# Patient Record
Sex: Male | Born: 1995 | Race: White | Hispanic: No | Marital: Single | Smoking: Never smoker
Health system: Southern US, Community
[De-identification: ages and names within clinical notes are randomized; demographics above are authoritative.]

## PROBLEM LIST (undated history)

## (undated) DIAGNOSIS — F209 Schizophrenia, unspecified: Secondary | ICD-10-CM

## (undated) HISTORY — PX: INNER EAR SURGERY: SHX679

---

## 2019-08-11 ENCOUNTER — Encounter (HOSPITAL_COMMUNITY): Admission: EM | Payer: Self-pay | Attending: Internal Medicine

## 2019-08-11 ENCOUNTER — Emergency Department (HOSPITAL_COMMUNITY)

## 2019-08-11 ENCOUNTER — Other Ambulatory Visit: Payer: Self-pay

## 2019-08-11 ENCOUNTER — Inpatient Hospital Stay (HOSPITAL_COMMUNITY)
Admission: EM | Admit: 2019-08-11 | Discharge: 2019-08-18 | DRG: 327 | Attending: Internal Medicine | Admitting: Internal Medicine

## 2019-08-11 ENCOUNTER — Encounter (HOSPITAL_COMMUNITY): Payer: Self-pay

## 2019-08-11 DIAGNOSIS — T189XXA Foreign body of alimentary tract, part unspecified, initial encounter: Secondary | ICD-10-CM

## 2019-08-11 DIAGNOSIS — Z20822 Contact with and (suspected) exposure to covid-19: Secondary | ICD-10-CM | POA: Diagnosis present

## 2019-08-11 DIAGNOSIS — T182XXA Foreign body in stomach, initial encounter: Secondary | ICD-10-CM | POA: Diagnosis not present

## 2019-08-11 DIAGNOSIS — Z915 Personal history of self-harm: Secondary | ICD-10-CM

## 2019-08-11 DIAGNOSIS — D72829 Elevated white blood cell count, unspecified: Secondary | ICD-10-CM | POA: Diagnosis not present

## 2019-08-11 DIAGNOSIS — F209 Schizophrenia, unspecified: Secondary | ICD-10-CM | POA: Diagnosis present

## 2019-08-11 DIAGNOSIS — Z9889 Other specified postprocedural states: Secondary | ICD-10-CM

## 2019-08-11 DIAGNOSIS — R45851 Suicidal ideations: Secondary | ICD-10-CM

## 2019-08-11 DIAGNOSIS — K259 Gastric ulcer, unspecified as acute or chronic, without hemorrhage or perforation: Secondary | ICD-10-CM | POA: Diagnosis present

## 2019-08-11 DIAGNOSIS — Z539 Procedure and treatment not carried out, unspecified reason: Secondary | ICD-10-CM | POA: Diagnosis not present

## 2019-08-11 HISTORY — DX: Schizophrenia, unspecified: F20.9

## 2019-08-11 LAB — CBC WITH DIFFERENTIAL/PLATELET
Abs Immature Granulocytes: 0.04 10*3/uL (ref 0.00–0.07)
Basophils Absolute: 0.1 10*3/uL (ref 0.0–0.1)
Basophils Relative: 1 %
Eosinophils Absolute: 0.4 10*3/uL (ref 0.0–0.5)
Eosinophils Relative: 3 %
HCT: 45.9 % (ref 39.0–52.0)
Hemoglobin: 14.5 g/dL (ref 13.0–17.0)
Immature Granulocytes: 0 %
Lymphocytes Relative: 22 %
Lymphs Abs: 2.8 10*3/uL (ref 0.7–4.0)
MCH: 29.7 pg (ref 26.0–34.0)
MCHC: 31.6 g/dL (ref 30.0–36.0)
MCV: 93.9 fL (ref 80.0–100.0)
Monocytes Absolute: 0.8 10*3/uL (ref 0.1–1.0)
Monocytes Relative: 6 %
Neutro Abs: 8.5 10*3/uL — ABNORMAL HIGH (ref 1.7–7.7)
Neutrophils Relative %: 68 %
Platelets: 368 10*3/uL (ref 150–400)
RBC: 4.89 MIL/uL (ref 4.22–5.81)
RDW: 13.2 % (ref 11.5–15.5)
WBC: 12.5 10*3/uL — ABNORMAL HIGH (ref 4.0–10.5)
nRBC: 0 % (ref 0.0–0.2)

## 2019-08-11 LAB — BASIC METABOLIC PANEL
Anion gap: 8 (ref 5–15)
BUN: 11 mg/dL (ref 6–20)
CO2: 23 mmol/L (ref 22–32)
Calcium: 8.9 mg/dL (ref 8.9–10.3)
Chloride: 110 mmol/L (ref 98–111)
Creatinine, Ser: 0.73 mg/dL (ref 0.61–1.24)
GFR calc Af Amer: >60 mL/min (ref >60)
GFR calc non Af Amer: >60 mL/min (ref >60)
Glucose, Bld: 89 mg/dL (ref 70–99)
Potassium: 3.8 mmol/L (ref 3.5–5.1)
Sodium: 141 mmol/L (ref 135–145)

## 2019-08-11 LAB — RESPIRATORY PANEL BY RT PCR (FLU A&B, COVID)
Influenza A by PCR: NEGATIVE
Influenza B by PCR: NEGATIVE
SARS Coronavirus 2 by RT PCR: NEGATIVE

## 2019-08-11 SURGERY — ESOPHAGOGASTRODUODENOSCOPY (EGD) WITH PROPOFOL
Anesthesia: Monitor Anesthesia Care

## 2019-08-11 MED ORDER — FENTANYL CITRATE (PF) 100 MCG/2ML IJ SOLN
INTRAMUSCULAR | Status: AC
  Filled 2019-08-11: qty 2

## 2019-08-11 MED ORDER — PROPOFOL 10 MG/ML IV BOLUS
INTRAVENOUS | Status: AC
  Filled 2019-08-11: qty 20

## 2019-08-11 MED ORDER — MIDAZOLAM HCL 2 MG/2ML IJ SOLN
INTRAMUSCULAR | Status: AC
  Filled 2019-08-11: qty 2

## 2019-08-11 MED ORDER — SODIUM CHLORIDE 0.9 % IV SOLN: INTRAVENOUS | Status: DC

## 2019-08-11 NOTE — Consult Note (Signed)
[facility]  Referring Provider: ER, Dr. Madilyn Hook Primary Care Physician:  None Primary Gastroenterologist:  Gentry Fitz  Reason for Consultation: Foreign body in UGI tract   HPI:  Norman Vasquez is a 24 y.o. male prisoner who swallowed a plastic spoon around 5pm tonight.  He says he swallowed it whole.  It was about 8-10cm long, hurt his throat going down. He has pains in his epigatrium now. No dysphagia. He is handling his secretions well.  No fevers, chills, no n/v   History reviewed. No pertinent past medical history.  History reviewed. No pertinent surgical history.  Prior to Admission medications   Not on File    Current Facility-Administered Medications  Medication Dose Route Frequency Provider Last Rate Last Admin  . [START ON 08/12/2019] 0.9 %  sodium chloride infusion   Intravenous Continuous Rachael Fee, MD       No current outpatient medications on file.    Allergies as of 08/11/2019 - Review Complete 08/11/2019  Allergen Reaction Noted  . Amoxicillin  08/11/2019  . Penicillins  08/11/2019  . Tylenol [acetaminophen]  08/11/2019    History reviewed. No pertinent family history.  Social History   Socioeconomic History  . Marital status: Single    Spouse name: Not on file  . Number of children: Not on file  . Years of education: Not on file  . Highest education level: Not on file  Occupational History  . Not on file  Tobacco Use  . Smoking status: Never Smoker  . Smokeless tobacco: Never Used  Substance and Sexual Activity  . Alcohol use: Not on file  . Drug use: Not on file  . Sexual activity: Not on file  Other Topics Concern  . Not on file  Social History Narrative  . Not on file   Social Determinants of Health   Financial Resource Strain:   . Difficulty of Paying Living Expenses:   Food Insecurity:   . Worried About Programme researcher, broadcasting/film/video in the Last Year:   . Barista in the Last Year:   Transportation Needs:   .  Freight forwarder (Medical):   Marland Kitchen Lack of Transportation (Non-Medical):   Physical Activity:   . Days of Exercise per Week:   . Minutes of Exercise per Session:   Stress:   . Feeling of Stress :   Social Connections:   . Frequency of Communication with Friends and Family:   . Frequency of Social Gatherings with Friends and Family:   . Attends Religious Services:   . Active Member of Clubs or Organizations:   . Attends Banker Meetings:   Marland Kitchen Marital Status:   Intimate Partner Violence:   . Fear of Current or Ex-Partner:   . Emotionally Abused:   Marland Kitchen Physically Abused:   . Sexually Abused:     Review of Systems: Pertinent positive and negative review of systems were noted in the above HPI section. Complete review of systems was performed and was otherwise normal.  Physical Exam: Vital signs in last 24 hours: Temp:  [98.8 F (37.1 C)] 98.8 F (37.1 C) (03/14 1828) Pulse Rate:  [86-105] 95 (03/14 2215) Resp:  [17] 17 (03/14 1828) BP: (130-141)/(91-98) 130/92 (03/14 2215) SpO2:  [86 %-100 %] 86 % (03/14 2215)   Constitutional: generally well-appearing Psychiatric: alert and oriented x3 Eyes: extraocular movements intact Mouth: oral pharynx moist, no lesions Neck: supple no lymphadenopathy Cardiovascular: heart regular rate and rhythm Lungs: clear to auscultation bilaterally  Abdomen: soft, nontender, nondistended, no obvious ascites, no peritoneal signs, normal bowel sounds Extremities: no lower extremity edema bilaterally Skin: no lesions on visible extremities  Lab Results: Recent Labs    08/11/19 2125  WBC 12.5*  HGB 14.5  HCT 45.9  PLT 368  MCV 93.9   BMET Recent Labs    08/11/19 2125  NA 141  K 3.8  CL 110  CO2 23  GLUCOSE 89  BUN 11  CREATININE 0.73  CALCIUM 8.9    Imaging/Other Results: DG Neck Soft Tissue  Result Date: 08/11/2019 CLINICAL DATA:  Rule out foreign body. EXAM: NECK SOFT TISSUES - 1+ VIEW COMPARISON:  None. FINDINGS:  There is no evidence of retropharyngeal soft tissue swelling or epiglottic enlargement. The cervical airway is unremarkable and no radio-opaque foreign body identified. IMPRESSION: Negative. Electronically Signed   By: Virgina Norfolk M.D.   On: 08/11/2019 19:28   DG Chest Portable 1 View  Result Date: 08/11/2019 CLINICAL DATA:  Rule out foreign body. EXAM: PORTABLE CHEST 1 VIEW COMPARISON:  Oct 16, 2018 FINDINGS: The heart size and mediastinal contours are within normal limits. Both lungs are clear. The visualized skeletal structures are unremarkable. A 6 mm radiopaque foreign body is seen overlying the right upper quadrant. This represents a new finding when compared to the prior study. IMPRESSION: 6 mm radiopaque foreign body overlying the right upper quadrant. This represents a new finding when compared to the prior study and may be consistent with interval cholecystectomy. Correlation with the patient's surgical history is recommended. Electronically Signed   By: Virgina Norfolk M.D.   On: 08/11/2019 19:27      Impression/Plan: 24 y.o. male who swallowed a long blunt object about 6 hours ago (plastic spoon).  He understands that he is at risk for perforation, bleeding, obstruction if the spoon is left inside. These could require surgery if they occur. I recommended EGD which he understands carries risk as well (perforation, bleeding, etc) and he agreed to proceed.  Milus Banister, MD  08/11/2019, 11:52 PM [facility] Pager [phone]

## 2019-08-11 NOTE — Progress Notes (Signed)
I spoke with PA Lillia Abed, he swallowed a plastic spoon whole to hurt himself. IT is unlikely that this will pass out of his stomach and so he needs endoscopy for retrieval.  Will need to be with anesthesia support, GA.  HE is getting a covid test and the results to be called to endo RN on call.  She is alerting anesthesia now as well.

## 2019-08-11 NOTE — H&P (View-Only) (Signed)
[facility]  Referring Provider: ER, Dr. Madilyn Hook Primary Care Physician:  None Primary Gastroenterologist:  Gentry Fitz  Reason for Consultation: Foreign body in UGI tract   HPI:  Norman Vasquez is a 24 y.o. male prisoner who swallowed a plastic spoon around 5pm tonight.  He says he swallowed it whole.  It was about 8-10cm long, hurt his throat going down. He has pains in his epigatrium now. No dysphagia. He is handling his secretions well.  No fevers, chills, no n/v   History reviewed. No pertinent past medical history.  History reviewed. No pertinent surgical history.  Prior to Admission medications   Not on File    Current Facility-Administered Medications  Medication Dose Route Frequency Provider Last Rate Last Admin  . [START ON 08/12/2019] 0.9 %  sodium chloride infusion   Intravenous Continuous Rachael Fee, MD       No current outpatient medications on file.    Allergies as of 08/11/2019 - Review Complete 08/11/2019  Allergen Reaction Noted  . Amoxicillin  08/11/2019  . Penicillins  08/11/2019  . Tylenol [acetaminophen]  08/11/2019    History reviewed. No pertinent family history.  Social History   Socioeconomic History  . Marital status: Single    Spouse name: Not on file  . Number of children: Not on file  . Years of education: Not on file  . Highest education level: Not on file  Occupational History  . Not on file  Tobacco Use  . Smoking status: Never Smoker  . Smokeless tobacco: Never Used  Substance and Sexual Activity  . Alcohol use: Not on file  . Drug use: Not on file  . Sexual activity: Not on file  Other Topics Concern  . Not on file  Social History Narrative  . Not on file   Social Determinants of Health   Financial Resource Strain:   . Difficulty of Paying Living Expenses:   Food Insecurity:   . Worried About Programme researcher, broadcasting/film/video in the Last Year:   . Barista in the Last Year:   Transportation Needs:   .  Freight forwarder (Medical):   Marland Kitchen Lack of Transportation (Non-Medical):   Physical Activity:   . Days of Exercise per Week:   . Minutes of Exercise per Session:   Stress:   . Feeling of Stress :   Social Connections:   . Frequency of Communication with Friends and Family:   . Frequency of Social Gatherings with Friends and Family:   . Attends Religious Services:   . Active Member of Clubs or Organizations:   . Attends Banker Meetings:   Marland Kitchen Marital Status:   Intimate Partner Violence:   . Fear of Current or Ex-Partner:   . Emotionally Abused:   Marland Kitchen Physically Abused:   . Sexually Abused:     Review of Systems: Pertinent positive and negative review of systems were noted in the above HPI section. Complete review of systems was performed and was otherwise normal.  Physical Exam: Vital signs in last 24 hours: Temp:  [98.8 F (37.1 C)] 98.8 F (37.1 C) (03/14 1828) Pulse Rate:  [86-105] 95 (03/14 2215) Resp:  [17] 17 (03/14 1828) BP: (130-141)/(91-98) 130/92 (03/14 2215) SpO2:  [86 %-100 %] 86 % (03/14 2215)   Constitutional: generally well-appearing Psychiatric: alert and oriented x3 Eyes: extraocular movements intact Mouth: oral pharynx moist, no lesions Neck: supple no lymphadenopathy Cardiovascular: heart regular rate and rhythm Lungs: clear to auscultation bilaterally  Abdomen: soft, nontender, nondistended, no obvious ascites, no peritoneal signs, normal bowel sounds Extremities: no lower extremity edema bilaterally Skin: no lesions on visible extremities  Lab Results: Recent Labs    08/11/19 2125  WBC 12.5*  HGB 14.5  HCT 45.9  PLT 368  MCV 93.9   BMET Recent Labs    08/11/19 2125  NA 141  K 3.8  CL 110  CO2 23  GLUCOSE 89  BUN 11  CREATININE 0.73  CALCIUM 8.9    Imaging/Other Results: DG Neck Soft Tissue  Result Date: 08/11/2019 CLINICAL DATA:  Rule out foreign body. EXAM: NECK SOFT TISSUES - 1+ VIEW COMPARISON:  None. FINDINGS:  There is no evidence of retropharyngeal soft tissue swelling or epiglottic enlargement. The cervical airway is unremarkable and no radio-opaque foreign body identified. IMPRESSION: Negative. Electronically Signed   By: Thaddeus  Houston M.D.   On: 08/11/2019 19:28   DG Chest Portable 1 View  Result Date: 08/11/2019 CLINICAL DATA:  Rule out foreign body. EXAM: PORTABLE CHEST 1 VIEW COMPARISON:  Oct 16, 2018 FINDINGS: The heart size and mediastinal contours are within normal limits. Both lungs are clear. The visualized skeletal structures are unremarkable. A 6 mm radiopaque foreign body is seen overlying the right upper quadrant. This represents a new finding when compared to the prior study. IMPRESSION: 6 mm radiopaque foreign body overlying the right upper quadrant. This represents a new finding when compared to the prior study and may be consistent with interval cholecystectomy. Correlation with the patient's surgical history is recommended. Electronically Signed   By: Thaddeus  Houston M.D.   On: 08/11/2019 19:27      Impression/Plan: 24 y.o. male who swallowed a long blunt object about 6 hours ago (plastic spoon).  He understands that he is at risk for perforation, bleeding, obstruction if the spoon is left inside. These could require surgery if they occur. I recommended EGD which he understands carries risk as well (perforation, bleeding, etc) and he agreed to proceed.  Shiela Bruns P Lavayah Vita, MD  08/11/2019, 11:52 PM [facility] Pager [phone]   

## 2019-08-11 NOTE — Anesthesia Preprocedure Evaluation (Addendum)
Anesthesia Evaluation  Patient identified by MRN, date of birth, ID band Patient awake    Reviewed: Allergy & Precautions, NPO status , Patient's Chart, lab work & pertinent test results  History of Anesthesia Complications (+) PONV  Airway Mallampati: II  TM Distance: >3 FB Neck ROM: Full    Dental no notable dental hx. (+) Teeth Intact, Dental Advisory Given   Pulmonary neg pulmonary ROS,    Pulmonary exam normal breath sounds clear to auscultation       Cardiovascular negative cardio ROS Normal cardiovascular exam Rhythm:Regular Rate:Normal     Neuro/Psych negative neurological ROS  negative psych ROS   GI/Hepatic negative GI ROS, Neg liver ROS,   Endo/Other  negative endocrine ROS  Renal/GU      Musculoskeletal negative musculoskeletal ROS (+)   Abdominal   Peds  Hematology negative hematology ROS (+)   Anesthesia Other Findings   Reproductive/Obstetrics                           Anesthesia Physical Anesthesia Plan  ASA: I and emergent  Anesthesia Plan: General   Post-op Pain Management:    Induction: Intravenous, Cricoid pressure planned and Rapid sequence  PONV Risk Score and Plan: 3 and Treatment may vary due to age or medical condition, Ondansetron and Dexamethasone  Airway Management Planned: Oral ETT  Additional Equipment: None  Intra-op Plan:   Post-operative Plan: Extubation in OR  Informed Consent: I have reviewed the patients History and Physical, chart, labs and discussed the procedure including the risks, benefits and alternatives for the proposed anesthesia with the patient or authorized representative who has indicated his/her understanding and acceptance.     Dental advisory given  Plan Discussed with: CRNA  Anesthesia Plan Comments:       Anesthesia Quick Evaluation

## 2019-08-11 NOTE — ED Provider Notes (Signed)
[facility]-EMERGENCY DEPT Provider Note   CSN: [id number] Arrival date & time: 08/11/19  1807     History Chief Complaint  Patient presents with  . Ingestion of Foreign body    Norman Vasquez is a 24 y.o. male brought in from [facility] for evaluation of swallowed foreign body.  He reports that about 5 PM, he swallowed a plastic spoon.  He reports some discomfort to his throat and upper chest area.  He has been able to tolerate his secretions and has had no difficulty swallowing.  Denies any pain with swelling.  Denies any difficulty breathing, abdominal pain.  He states that he was trying to do this to hurt himself but not kill himself.  He states he has a history of trying to hurt himself.  He is currently on [facility] watch [facility] jail.  The officers who are here at bedside did not actually witness the incident.  They state that other officers saw it but cannot confirm whether or not he swallowed it.  Patient states he swallowed the entire spoon.  The history is provided by the patient.       History reviewed. No pertinent past medical history.  There are no problems to display for this patient.   History reviewed. No pertinent surgical history.     History reviewed. No pertinent family history.  Social History   Tobacco Use  . Smoking status: Never Smoker  . Smokeless tobacco: Never Used  Substance Use Topics  . Alcohol use: Not on file  . Drug use: Not on file    Home Medications Prior to Admission medications   Not on File    Allergies    Amoxicillin, Penicillins, and Tylenol [acetaminophen]  Review of Systems   Review of Systems  HENT: Positive for sore throat. Negative for trouble swallowing.   Respiratory: Negative for shortness of breath.   Gastrointestinal: Negative for vomiting.  All other systems reviewed and are negative.   Physical Exam Updated Vital Signs BP (!) 126/93   Pulse 100   Temp 98.8 F (37.1 C)  (Oral)   Resp 17   SpO2 98%   Physical Exam Vitals and nursing note reviewed.  Constitutional:      Appearance: He is well-developed.  HENT:     Head: Normocephalic and atraumatic.     Mouth/Throat:     Comments: Posterior oropharynx is clear without any signs of erythema, edema. Eyes:     General: No scleral icterus.       Right eye: No discharge.        Left eye: No discharge.     Conjunctiva/sclera: Conjunctivae normal.  Neck:     Comments: Neck is without edema, crepitus.  No evidence of swelling.  He is able to swallow with any difficulty. Pulmonary:     Effort: Pulmonary effort is normal.     Breath sounds: Normal breath sounds.     Comments: Lungs clear to auscultation bilaterally.  Symmetric chest rise.  No wheezing, rales, rhonchi. Abdominal:     Comments: Abdomen is soft, non-distended, non-tender. No rigidity, No guarding. No peritoneal signs.  Skin:    General: Skin is warm and dry.  Neurological:     Mental Status: He is alert.  Psychiatric:        Speech: Speech normal.        Behavior: Behavior normal.     ED Results / Procedures / Treatments   Labs (all labs ordered are  listed, but only abnormal results are displayed) Labs Reviewed  CBC WITH DIFFERENTIAL/PLATELET - Abnormal; Notable for the following components:      Result Value   WBC 12.5 (*)    Neutro Abs 8.5 (*)    All other components within normal limits  RESPIRATORY PANEL BY RT PCR (FLU A&B, COVID)  BASIC METABOLIC PANEL    EKG None  Radiology DG Neck Soft Tissue  Result Date: 08/11/2019 CLINICAL DATA:  Rule out foreign body. EXAM: NECK SOFT TISSUES - 1+ VIEW COMPARISON:  None. FINDINGS: There is no evidence of retropharyngeal soft tissue swelling or epiglottic enlargement. The cervical airway is unremarkable and no radio-opaque foreign body identified. IMPRESSION: Negative. Electronically Signed   By: Aram Candela M.D.   On: 08/11/2019 19:28   DG Chest Portable 1 View  Result  Date: 08/11/2019 CLINICAL DATA:  Rule out foreign body. EXAM: PORTABLE CHEST 1 VIEW COMPARISON:  Oct 16, 2018 FINDINGS: The heart size and mediastinal contours are within normal limits. Both lungs are clear. The visualized skeletal structures are unremarkable. A 6 mm radiopaque foreign body is seen overlying the right upper quadrant. This represents a new finding when compared to the prior study. IMPRESSION: 6 mm radiopaque foreign body overlying the right upper quadrant. This represents a new finding when compared to the prior study and may be consistent with interval cholecystectomy. Correlation with the patient's surgical history is recommended. Electronically Signed   By: Aram Candela M.D.   On: 08/11/2019 19:27    Procedures Procedures (including critical care time)  Medications Ordered in ED Medications  0.9 %  sodium chloride infusion (has no administration in time range)    ED Course  I have reviewed the triage vital signs and the nursing notes.  Pertinent labs & imaging results that were available during my care of the patient were reviewed by me and considered in my medical decision making (see chart for details).    MDM Rules/Calculators/A&P                      24 year old male who presents for evaluation of swallowed foreign body.  He is currently at jail and reports swallowing a plastic spoon.  Reports discomfort in his throat and upper chest.  No difficulty breathing, difficulty swallowing, abdominal pain, vomiting. Patient is afebrile, non-toxic appearing, sitting comfortably on examination table. Vital signs reviewed and stable.  On exam, posterior oropharynx is clear.  No signs of respiratory distress.  No crepitus noted to neck or chest.  We will plan for x-rays of chest and soft tissue neck.  Discussed with police officers.  They do not require psychiatric clearance from our department as patient has already been listed on psych watch at prison.   Chest x-ray shows a  6 mm radiopaque foreign body overlying the right upper quadrant.  This represents a new finding when compared to prior study.  X-ray of soft tissue neck shows no acute abnormalities.  Reevaluation.  Patient with no abdominal pain.  He has no abdominal incision scar.  He denies any history of abdominal surgery.  Discussed patient with Dr. Christella Hartigan (GI).  Given the unclear location based on x-ray, he cannot exclude if it is in the stomach or not.  He recommends obtaining a CT abdomen pelvis to see if there is any residual pieces in the stomach.  If there are, he might need to do an endoscopy to remove it but if it is past the stomach,  there is no other acute intervention.  Discussed with Dr. Ardis Hughs.  Given that patient is reporting that he swallowed the entire spoon, it would not pass without any intervention.  He states that it may not and certainly show up on x-ray either.  He will plan to take patient to the endoscopy suite once Covid test is back.  Covid test is negative.  BMP is unremarkable.  CBC shows leukocytosis of 12.5.  I updated the endoscopy nurse.  I discussed with Dr. Ardis Hughs also.  He is aware. Patient will go to endoscopy suite for removal.   Norman Vasquez was evaluated in Emergency Department on 08/11/2019 for the symptoms described in the history of present illness. He was evaluated in the context of the global COVID-19 pandemic, which necessitated consideration that the patient might be at risk for infection with the SARS-CoV-2 virus that causes COVID-19. Institutional protocols and algorithms that pertain to the evaluation of patients at risk for COVID-19 are in a state of rapid change based on information released by regulatory bodies including the CDC and federal and state organizations. These policies and algorithms were followed during the patient's care in the ED.  Portions of this note were generated with [employer]. Dictation errors may occur despite best  attempts at proofreading.   Final Clinical Impression(s) / ED Diagnoses Final diagnoses:  Swallowed foreign body, initial encounter    Rx / DC Orders ED Discharge Orders    None       Desma Mcgregor 08/11/19 2355    Quintella Reichert, MD 08/14/19 [phone]

## 2019-08-11 NOTE — ED Triage Notes (Signed)
Pt arrives in custody from [facility], states he swallowed a plastic spoon at approx 1730 and believes it is still stuck in his throat, c/o throat pain.

## 2019-08-12 ENCOUNTER — Emergency Department (HOSPITAL_COMMUNITY): Admitting: Anesthesiology

## 2019-08-12 ENCOUNTER — Encounter (HOSPITAL_COMMUNITY): Admission: EM | Payer: Self-pay | Attending: Internal Medicine

## 2019-08-12 ENCOUNTER — Encounter (HOSPITAL_COMMUNITY): Payer: Self-pay

## 2019-08-12 ENCOUNTER — Inpatient Hospital Stay (HOSPITAL_COMMUNITY): Admitting: Certified Registered"

## 2019-08-12 ENCOUNTER — Inpatient Hospital Stay (HOSPITAL_COMMUNITY)

## 2019-08-12 DIAGNOSIS — Z20822 Contact with and (suspected) exposure to covid-19: Secondary | ICD-10-CM | POA: Diagnosis present

## 2019-08-12 DIAGNOSIS — T189XXD Foreign body of alimentary tract, part unspecified, subsequent encounter: Secondary | ICD-10-CM

## 2019-08-12 DIAGNOSIS — Z915 Personal history of self-harm: Secondary | ICD-10-CM | POA: Diagnosis not present

## 2019-08-12 DIAGNOSIS — Z539 Procedure and treatment not carried out, unspecified reason: Secondary | ICD-10-CM | POA: Diagnosis not present

## 2019-08-12 DIAGNOSIS — T189XXA Foreign body of alimentary tract, part unspecified, initial encounter: Secondary | ICD-10-CM

## 2019-08-12 DIAGNOSIS — R45851 Suicidal ideations: Secondary | ICD-10-CM

## 2019-08-12 DIAGNOSIS — K259 Gastric ulcer, unspecified as acute or chronic, without hemorrhage or perforation: Secondary | ICD-10-CM | POA: Diagnosis present

## 2019-08-12 DIAGNOSIS — T182XXD Foreign body in stomach, subsequent encounter: Secondary | ICD-10-CM

## 2019-08-12 DIAGNOSIS — F209 Schizophrenia, unspecified: Secondary | ICD-10-CM | POA: Diagnosis present

## 2019-08-12 DIAGNOSIS — T182XXA Foreign body in stomach, initial encounter: Secondary | ICD-10-CM | POA: Diagnosis present

## 2019-08-12 DIAGNOSIS — D72829 Elevated white blood cell count, unspecified: Secondary | ICD-10-CM | POA: Diagnosis not present

## 2019-08-12 HISTORY — PX: ESOPHAGOGASTRODUODENOSCOPY: SHX5428

## 2019-08-12 HISTORY — PX: LAPAROTOMY: SHX154

## 2019-08-12 HISTORY — PX: FOREIGN BODY REMOVAL: SHX962

## 2019-08-12 LAB — CBC WITH DIFFERENTIAL/PLATELET
Abs Immature Granulocytes: 0.04 10*3/uL (ref 0.00–0.07)
Basophils Absolute: 0 10*3/uL (ref 0.0–0.1)
Basophils Relative: 0 %
Eosinophils Absolute: 0 10*3/uL (ref 0.0–0.5)
Eosinophils Relative: 0 %
HCT: 44.3 % (ref 39.0–52.0)
Hemoglobin: 14.1 g/dL (ref 13.0–17.0)
Immature Granulocytes: 0 %
Lymphocytes Relative: 11 %
Lymphs Abs: 1.2 10*3/uL (ref 0.7–4.0)
MCH: 29.1 pg (ref 26.0–34.0)
MCHC: 31.8 g/dL (ref 30.0–36.0)
MCV: 91.5 fL (ref 80.0–100.0)
Monocytes Absolute: 0.1 10*3/uL (ref 0.1–1.0)
Monocytes Relative: 1 %
Neutro Abs: 9.6 10*3/uL — ABNORMAL HIGH (ref 1.7–7.7)
Neutrophils Relative %: 88 %
Platelets: 333 10*3/uL (ref 150–400)
RBC: 4.84 MIL/uL (ref 4.22–5.81)
RDW: 13.1 % (ref 11.5–15.5)
WBC: 11 10*3/uL — ABNORMAL HIGH (ref 4.0–10.5)
nRBC: 0 % (ref 0.0–0.2)

## 2019-08-12 LAB — COMPREHENSIVE METABOLIC PANEL
ALT: 80 U/L — ABNORMAL HIGH (ref 0–44)
AST: 41 U/L (ref 15–41)
Albumin: 3.6 g/dL (ref 3.5–5.0)
Alkaline Phosphatase: 63 U/L (ref 38–126)
Anion gap: 8 (ref 5–15)
BUN: 13 mg/dL (ref 6–20)
CO2: 25 mmol/L (ref 22–32)
Calcium: 9.2 mg/dL (ref 8.9–10.3)
Chloride: 106 mmol/L (ref 98–111)
Creatinine, Ser: 0.9 mg/dL (ref 0.61–1.24)
GFR calc Af Amer: >60 mL/min (ref >60)
GFR calc non Af Amer: >60 mL/min (ref >60)
Glucose, Bld: 121 mg/dL — ABNORMAL HIGH (ref 70–99)
Potassium: 4.4 mmol/L (ref 3.5–5.1)
Sodium: 139 mmol/L (ref 135–145)
Total Bilirubin: 0.7 mg/dL (ref 0.3–1.2)
Total Protein: 8 g/dL (ref 6.5–8.1)

## 2019-08-12 LAB — ABO/RH: ABO/RH(D): A POS

## 2019-08-12 LAB — TYPE AND SCREEN
ABO/RH(D): A POS
Antibody Screen: NEGATIVE

## 2019-08-12 LAB — ACETAMINOPHEN LEVEL: Acetaminophen (Tylenol), Serum: <10 ug/mL — ABNORMAL LOW (ref 10–30)

## 2019-08-12 LAB — HIV ANTIBODY (ROUTINE TESTING W REFLEX): HIV Screen 4th Generation wRfx: NONREACTIVE

## 2019-08-12 LAB — PROTIME-INR
INR: 1.2 (ref 0.8–1.2)
Prothrombin Time: 14.6 seconds (ref 11.4–15.2)

## 2019-08-12 LAB — SALICYLATE LEVEL: Salicylate Lvl: <7 mg/dL — ABNORMAL LOW (ref 7.0–30.0)

## 2019-08-12 SURGERY — EGD (ESOPHAGOGASTRODUODENOSCOPY)
Anesthesia: General

## 2019-08-12 SURGERY — LAPAROTOMY, EXPLORATORY
Anesthesia: General | Site: Abdomen

## 2019-08-12 MED ORDER — DEXTROSE-NACL 5-0.9 % IV SOLN: INTRAVENOUS | Status: AC

## 2019-08-12 MED ORDER — FENTANYL CITRATE (PF) 100 MCG/2ML IJ SOLN
INTRAMUSCULAR | Status: AC
  Administered 2019-08-12: 50 ug via INTRAVENOUS
  Filled 2019-08-12: qty 2

## 2019-08-12 MED ORDER — SUGAMMADEX SODIUM 200 MG/2ML IV SOLN
INTRAVENOUS | Status: DC | PRN
  Administered 2019-08-12: 100 mg via INTRAVENOUS
  Administered 2019-08-12: 400 mg via INTRAVENOUS

## 2019-08-12 MED ORDER — ONDANSETRON HCL 4 MG/2ML IJ SOLN
INTRAMUSCULAR | Status: AC
  Filled 2019-08-12: qty 2

## 2019-08-12 MED ORDER — HYDROMORPHONE 1 MG/ML IV SOLN
INTRAVENOUS | Status: DC
  Administered 2019-08-12: 3 mg via INTRAVENOUS
  Administered 2019-08-12: 30 mg via INTRAVENOUS
  Administered 2019-08-12 – 2019-08-13 (×2): 1.8 mg via INTRAVENOUS
  Administered 2019-08-13: 1.5 mg via INTRAVENOUS
  Administered 2019-08-13: 1.8 mg via INTRAVENOUS
  Administered 2019-08-13: 2.4 mg via INTRAVENOUS
  Administered 2019-08-13: 0.9 mg via INTRAVENOUS
  Administered 2019-08-13: 2.4 mg via INTRAVENOUS
  Administered 2019-08-13: 1.2 mg via INTRAVENOUS
  Administered 2019-08-14: 3.6 mg via INTRAVENOUS
  Administered 2019-08-14: 30 mg via INTRAVENOUS
  Administered 2019-08-14: 2.4 mg via INTRAVENOUS
  Administered 2019-08-14: 3.6 mg via INTRAVENOUS
  Administered 2019-08-14: 3 mg via INTRAVENOUS
  Administered 2019-08-14: 1.5 mg via INTRAVENOUS
  Administered 2019-08-15: 2.1 mg via INTRAVENOUS
  Filled 2019-08-12 (×2): qty 30

## 2019-08-12 MED ORDER — DEXTROSE-NACL 5-0.9 % IV SOLN: INTRAVENOUS | Status: DC

## 2019-08-12 MED ORDER — LIDOCAINE 2% (20 MG/ML) 5 ML SYRINGE
INTRAMUSCULAR | Status: AC
  Filled 2019-08-12: qty 5

## 2019-08-12 MED ORDER — DEXAMETHASONE SODIUM PHOSPHATE 10 MG/ML IJ SOLN
INTRAMUSCULAR | Status: DC | PRN
  Administered 2019-08-12: 10 mg via INTRAVENOUS

## 2019-08-12 MED ORDER — MIDAZOLAM HCL 2 MG/2ML IJ SOLN
INTRAMUSCULAR | Status: AC
  Filled 2019-08-12: qty 2

## 2019-08-12 MED ORDER — FENTANYL CITRATE (PF) 250 MCG/5ML IJ SOLN
INTRAMUSCULAR | Status: DC | PRN
  Administered 2019-08-12 (×2): 25 ug via INTRAVENOUS
  Administered 2019-08-12: 100 ug via INTRAVENOUS
  Administered 2019-08-12 (×2): 50 ug via INTRAVENOUS

## 2019-08-12 MED ORDER — CHLORHEXIDINE GLUCONATE CLOTH 2 % EX PADS: 6.0000 | MEDICATED_PAD | Freq: Once | CUTANEOUS | Status: DC

## 2019-08-12 MED ORDER — CIPROFLOXACIN IN D5W 400 MG/200ML IV SOLN
400.0000 mg | INTRAVENOUS | Status: AC
  Administered 2019-08-12: 400 mg via INTRAVENOUS
  Filled 2019-08-12: qty 200

## 2019-08-12 MED ORDER — ESMOLOL HCL 100 MG/10ML IV SOLN
INTRAVENOUS | Status: AC
  Filled 2019-08-12: qty 10

## 2019-08-12 MED ORDER — LIDOCAINE 2% (20 MG/ML) 5 ML SYRINGE
INTRAMUSCULAR | Status: DC | PRN
  Administered 2019-08-12: 100 mg via INTRAVENOUS

## 2019-08-12 MED ORDER — DEXAMETHASONE SODIUM PHOSPHATE 10 MG/ML IJ SOLN
INTRAMUSCULAR | Status: AC
  Filled 2019-08-12: qty 1

## 2019-08-12 MED ORDER — DEXMEDETOMIDINE HCL IN NACL 200 MCG/50ML IV SOLN
INTRAVENOUS | Status: AC
  Filled 2019-08-12: qty 50

## 2019-08-12 MED ORDER — SUCCINYLCHOLINE CHLORIDE 200 MG/10ML IV SOSY
PREFILLED_SYRINGE | INTRAVENOUS | Status: DC | PRN
  Administered 2019-08-12 (×2): 100 mg via INTRAVENOUS

## 2019-08-12 MED ORDER — FENTANYL CITRATE (PF) 250 MCG/5ML IJ SOLN
INTRAMUSCULAR | Status: AC
  Filled 2019-08-12: qty 5

## 2019-08-12 MED ORDER — METRONIDAZOLE IN NACL 5-0.79 MG/ML-% IV SOLN
500.0000 mg | INTRAVENOUS | Status: AC
  Administered 2019-08-12: 500 mg via INTRAVENOUS
  Filled 2019-08-12: qty 100

## 2019-08-12 MED ORDER — PROPOFOL 10 MG/ML IV BOLUS
INTRAVENOUS | Status: DC | PRN
  Administered 2019-08-12: 150 mg via INTRAVENOUS

## 2019-08-12 MED ORDER — ROCURONIUM BROMIDE 10 MG/ML (PF) SYRINGE
PREFILLED_SYRINGE | INTRAVENOUS | Status: DC | PRN
  Administered 2019-08-12: 20 mg via INTRAVENOUS

## 2019-08-12 MED ORDER — SODIUM CHLORIDE 0.9 % IR SOLN
Status: DC | PRN
  Administered 2019-08-12: 2000 mL

## 2019-08-12 MED ORDER — IOHEXOL 300 MG/ML  SOLN: 100.0000 mL | Freq: Once | INTRAMUSCULAR | Status: DC | PRN

## 2019-08-12 MED ORDER — METRONIDAZOLE IN NACL 5-0.79 MG/ML-% IV SOLN
500.0000 mg | Freq: Three times a day (TID) | INTRAVENOUS | Status: DC
  Administered 2019-08-12 – 2019-08-17 (×15): 500 mg via INTRAVENOUS
  Filled 2019-08-12 (×15): qty 100

## 2019-08-12 MED ORDER — KETOROLAC TROMETHAMINE 15 MG/ML IJ SOLN: 15.0000 mg | Freq: Four times a day (QID) | INTRAMUSCULAR | Status: DC | PRN

## 2019-08-12 MED ORDER — LIDOCAINE 2% (20 MG/ML) 5 ML SYRINGE
INTRAMUSCULAR | Status: DC | PRN
  Administered 2019-08-12: 40 mg via INTRAVENOUS

## 2019-08-12 MED ORDER — HALOPERIDOL LACTATE 5 MG/ML IJ SOLN
5.0000 mg | Freq: Four times a day (QID) | INTRAMUSCULAR | Status: DC | PRN
  Filled 2019-08-12: qty 1

## 2019-08-12 MED ORDER — METHOCARBAMOL 1000 MG/10ML IJ SOLN
1000.0000 mg | Freq: Three times a day (TID) | INTRAVENOUS | Status: DC
  Administered 2019-08-12 – 2019-08-15 (×9): 1000 mg via INTRAVENOUS
  Filled 2019-08-12 (×7): qty 10
  Filled 2019-08-12: qty 1000
  Filled 2019-08-12 (×2): qty 10
  Filled 2019-08-12 (×2): qty 1000
  Filled 2019-08-12: qty 10

## 2019-08-12 MED ORDER — ONDANSETRON HCL 4 MG/2ML IJ SOLN: 4.0000 mg | Freq: Four times a day (QID) | INTRAMUSCULAR | Status: DC | PRN

## 2019-08-12 MED ORDER — ONDANSETRON HCL 4 MG/2ML IJ SOLN
INTRAMUSCULAR | Status: DC | PRN
  Administered 2019-08-12: 4 mg via INTRAVENOUS

## 2019-08-12 MED ORDER — MIDAZOLAM HCL 2 MG/2ML IJ SOLN
INTRAMUSCULAR | Status: DC | PRN
  Administered 2019-08-12: 2 mg via INTRAVENOUS

## 2019-08-12 MED ORDER — ONDANSETRON HCL 4 MG/2ML IJ SOLN
4.0000 mg | Freq: Four times a day (QID) | INTRAMUSCULAR | Status: DC | PRN
  Administered 2019-08-14: 4 mg via INTRAVENOUS
  Filled 2019-08-12: qty 2

## 2019-08-12 MED ORDER — CHLORHEXIDINE GLUCONATE CLOTH 2 % EX PADS
6.0000 | MEDICATED_PAD | Freq: Every day | CUTANEOUS | Status: DC
  Administered 2019-08-13 – 2019-08-18 (×5): 6 via TOPICAL

## 2019-08-12 MED ORDER — PROPOFOL 10 MG/ML IV BOLUS
INTRAVENOUS | Status: DC | PRN
  Administered 2019-08-12: 200 mg via INTRAVENOUS

## 2019-08-12 MED ORDER — DIPHENHYDRAMINE HCL 50 MG/ML IJ SOLN: 12.5000 mg | Freq: Four times a day (QID) | INTRAMUSCULAR | Status: DC | PRN

## 2019-08-12 MED ORDER — PANTOPRAZOLE SODIUM 40 MG IV SOLR
40.0000 mg | Freq: Two times a day (BID) | INTRAVENOUS | Status: DC
  Administered 2019-08-12 – 2019-08-15 (×6): 40 mg via INTRAVENOUS
  Filled 2019-08-12 (×7): qty 40

## 2019-08-12 MED ORDER — SUCCINYLCHOLINE CHLORIDE 200 MG/10ML IV SOSY
PREFILLED_SYRINGE | INTRAVENOUS | Status: AC
  Filled 2019-08-12: qty 10

## 2019-08-12 MED ORDER — CIPROFLOXACIN IN D5W 400 MG/200ML IV SOLN
400.0000 mg | Freq: Two times a day (BID) | INTRAVENOUS | Status: DC
  Administered 2019-08-13 – 2019-08-16 (×9): 400 mg via INTRAVENOUS
  Filled 2019-08-12 (×9): qty 200

## 2019-08-12 MED ORDER — SUGAMMADEX SODIUM 200 MG/2ML IV SOLN
INTRAVENOUS | Status: DC | PRN
  Administered 2019-08-12: 200 mg via INTRAVENOUS

## 2019-08-12 MED ORDER — SUCCINYLCHOLINE CHLORIDE 200 MG/10ML IV SOSY
PREFILLED_SYRINGE | INTRAVENOUS | Status: DC | PRN
  Administered 2019-08-12: 140 mg via INTRAVENOUS

## 2019-08-12 MED ORDER — ONDANSETRON HCL 4 MG PO TABS: 4.0000 mg | ORAL_TABLET | Freq: Four times a day (QID) | ORAL | Status: DC | PRN

## 2019-08-12 MED ORDER — ENOXAPARIN SODIUM 40 MG/0.4ML ~~LOC~~ SOLN
40.0000 mg | SUBCUTANEOUS | Status: DC
  Administered 2019-08-13 – 2019-08-18 (×6): 40 mg via SUBCUTANEOUS
  Filled 2019-08-12 (×6): qty 0.4

## 2019-08-12 MED ORDER — DEXMEDETOMIDINE HCL 200 MCG/2ML IV SOLN
INTRAVENOUS | Status: DC | PRN
  Administered 2019-08-12: 8 ug via INTRAVENOUS

## 2019-08-12 MED ORDER — ROCURONIUM BROMIDE 10 MG/ML (PF) SYRINGE
PREFILLED_SYRINGE | INTRAVENOUS | Status: AC
  Filled 2019-08-12: qty 10

## 2019-08-12 MED ORDER — PROPOFOL 10 MG/ML IV BOLUS
INTRAVENOUS | Status: AC
  Filled 2019-08-12: qty 20

## 2019-08-12 MED ORDER — CHLORHEXIDINE GLUCONATE CLOTH 2 % EX PADS
6.0000 | MEDICATED_PAD | Freq: Once | CUTANEOUS | Status: DC
  Administered 2019-08-12: 6 via TOPICAL

## 2019-08-12 MED ORDER — SODIUM CHLORIDE (PF) 0.9 % IJ SOLN
INTRAMUSCULAR | Status: AC
  Filled 2019-08-12: qty 50

## 2019-08-12 MED ORDER — ROCURONIUM BROMIDE 10 MG/ML (PF) SYRINGE
PREFILLED_SYRINGE | INTRAVENOUS | Status: DC | PRN
  Administered 2019-08-12: 50 mg via INTRAVENOUS
  Administered 2019-08-12: 10 mg via INTRAVENOUS

## 2019-08-12 MED ORDER — NALOXONE HCL 0.4 MG/ML IJ SOLN: 0.4000 mg | INTRAMUSCULAR | Status: DC | PRN

## 2019-08-12 MED ORDER — ALBUMIN HUMAN 5 % IV SOLN
INTRAVENOUS | Status: AC
  Filled 2019-08-12: qty 250

## 2019-08-12 MED ORDER — FENTANYL CITRATE (PF) 100 MCG/2ML IJ SOLN
INTRAMUSCULAR | Status: AC
  Filled 2019-08-12: qty 2

## 2019-08-12 MED ORDER — FENTANYL CITRATE (PF) 100 MCG/2ML IJ SOLN
25.0000 ug | INTRAMUSCULAR | Status: DC | PRN
  Administered 2019-08-12: 50 ug via INTRAVENOUS

## 2019-08-12 MED ORDER — FENTANYL CITRATE (PF) 250 MCG/5ML IJ SOLN
INTRAMUSCULAR | Status: DC | PRN
  Administered 2019-08-12: 50 ug via INTRAVENOUS
  Administered 2019-08-12: 100 ug via INTRAVENOUS
  Administered 2019-08-12: 50 ug via INTRAVENOUS

## 2019-08-12 MED ORDER — LACTATED RINGERS IV SOLN: INTRAVENOUS | Status: DC | PRN

## 2019-08-12 MED ORDER — DIPHENHYDRAMINE HCL 12.5 MG/5ML PO ELIX
12.5000 mg | ORAL_SOLUTION | Freq: Four times a day (QID) | ORAL | Status: DC | PRN
  Filled 2019-08-12: qty 5

## 2019-08-12 MED ORDER — ESMOLOL HCL 100 MG/10ML IV SOLN
INTRAVENOUS | Status: DC | PRN
  Administered 2019-08-12: 20 mg via INTRAVENOUS

## 2019-08-12 MED ORDER — FENTANYL CITRATE (PF) 100 MCG/2ML IJ SOLN: 25.0000 ug | INTRAMUSCULAR | Status: DC | PRN

## 2019-08-12 MED ORDER — SODIUM CHLORIDE 0.9% FLUSH: 9.0000 mL | INTRAVENOUS | Status: DC | PRN

## 2019-08-12 SURGICAL SUPPLY — 36 items
BLADE EXTENDED COATED 6.5IN (ELECTRODE) IMPLANT
BLADE HEX COATED 2.75 (ELECTRODE) ×1 IMPLANT
COVER MAYO STAND STRL (DRAPES) ×2 IMPLANT
COVER WAND RF STERILE (DRAPES) IMPLANT
DRAPE LAPAROSCOPIC ABDOMINAL (DRAPES) ×3 IMPLANT
DRAPE WARM FLUID 44X44 (DRAPES) ×2 IMPLANT
DRSG OPSITE POSTOP 4X8 (GAUZE/BANDAGES/DRESSINGS) ×2 IMPLANT
ELECT PENCIL ROCKER SW 15FT (MISCELLANEOUS) ×2 IMPLANT
ELECT REM PT RETURN 15FT ADLT (MISCELLANEOUS) ×3 IMPLANT
GAUZE SPONGE 4X4 12PLY STRL (GAUZE/BANDAGES/DRESSINGS) ×1 IMPLANT
GLOVE BIOGEL PI IND STRL 7.0 (GLOVE) ×1 IMPLANT
GLOVE BIOGEL PI INDICATOR 7.0 (GLOVE) ×2
GLOVE INDICATOR 8.0 STRL GRN (GLOVE) ×6 IMPLANT
GLOVE SS BIOGEL STRL SZ 7.5 (GLOVE) ×1 IMPLANT
GLOVE SUPERSENSE BIOGEL SZ 7.5 (GLOVE) ×2
GOWN STRL REUS W/TWL LRG LVL3 (GOWN DISPOSABLE) ×3 IMPLANT
GOWN STRL REUS W/TWL XL LVL3 (GOWN DISPOSABLE) ×6 IMPLANT
HANDLE SUCTION POOLE (INSTRUMENTS) IMPLANT
KIT BASIN OR (CUSTOM PROCEDURE TRAY) ×3 IMPLANT
KIT TURNOVER KIT A (KITS) IMPLANT
PACK GENERAL/GYN (CUSTOM PROCEDURE TRAY) ×3 IMPLANT
PENCIL SMOKE EVACUATOR (MISCELLANEOUS) IMPLANT
SPONGE LAP 18X18 RF (DISPOSABLE) IMPLANT
STAPLER VISISTAT 35W (STAPLE) ×3 IMPLANT
SUCTION POOLE HANDLE (INSTRUMENTS) ×3
SUT PDS AB 1 CTX 36 (SUTURE) ×4 IMPLANT
SUT SILK 2 0 (SUTURE)
SUT SILK 2-0 18XBRD TIE 12 (SUTURE) IMPLANT
SUT SILK 3 0 (SUTURE)
SUT SILK 3 0 SH CR/8 (SUTURE) ×2 IMPLANT
SUT SILK 3-0 18XBRD TIE 12 (SUTURE) IMPLANT
SUT VIC AB 2-0 SH 18 (SUTURE) ×6 IMPLANT
SUT VIC AB 3-0 SH 18 (SUTURE) IMPLANT
TOWEL OR 17X26 10 PK STRL BLUE (TOWEL DISPOSABLE) ×6 IMPLANT
TRAY FOLEY MTR SLVR 16FR STAT (SET/KITS/TRAYS/PACK) ×2 IMPLANT
YANKAUER SUCT BULB TIP NO VENT (SUCTIONS) IMPLANT

## 2019-08-12 NOTE — Progress Notes (Signed)
D; Pt. came from Endo, awake, BSS, no pain. Need open surg. Will admit by hospitalist. Back to ER #5.

## 2019-08-12 NOTE — Transfer of Care (Signed)
Immediate Anesthesia Transfer of Care Note  Patient: Demontez Bagsby  Procedure(s) Performed: ESOPHAGOGASTRODUODENOSCOPY (EGD) (N/A ) FOREIGN BODY REMOVAL (N/A )  Patient Location: PACU  Anesthesia Type:General  Level of Consciousness: awake, alert  and oriented  Airway & Oxygen Therapy: Patient Spontanous Breathing and Patient connected to face mask oxygen  Post-op Assessment: Report given to RN and Post -op Vital signs reviewed and stable  Post vital signs: Reviewed and stable  Last Vitals:  Vitals Value Taken Time  BP 144/98 08/12/19 0131  Temp    Pulse 112 08/12/19 0133  Resp 13 08/12/19 0133  SpO2 100 % 08/12/19 0133  Vitals shown include unvalidated device data.  Last Pain:  Vitals:   08/12/19 0010  TempSrc: Oral  PainSc: 0-No pain         Complications: No apparent anesthesia complications

## 2019-08-12 NOTE — Op Note (Addendum)
Preoperative diagnosis: Ingestion of multiple foreign bodies  Postoperative diagnosis: Same  Procedure: Exploratory laparotomy with gastrotomy/ gastrorrhaphy and removal of foreign bodies (ball point pen and 3 spoons)  Surgeon: Erroll Luna MD  Assist:  Wynetta Emery PA C  Anesthesia: General  EBL: Less than 10 cc  Drains: None  IV fluids: Per anesthesia record  Indications for procedure: The patient is a 24 year old incarcerated male who was brought to the emergency room yesterday due to abdominal pain and possible ingestion of foreign bodies.  By report he ingested plastic spoons and a ballpoint pen.  He underwent CT scanning earlier this morning which showed foreign bodies in the stomach and GI was consulted.  Endoscopy was performed and plastic spoons and ballpoint pen were noted.  The ballpoint pen was in the duodenum and this was pulled back.  Unfortunately, the foreign bodies could not be extracted endoscopically through the esophagus.  Surgery was consulted.  The CT scan was reviewed the patient examined.  He had what appeared to be 3 plastic spoons and a pen in his stomach.  I discussed this with the patient.  I discussed the need for surgical exploration removal.  He consented and agreed.The procedure has been discussed with the patient.  Alternative therapies have been discussed with the patient.  Operative risks include bleeding,  Infection,  Organ injury,  Nerve injury,  Blood vessel injury,  DVT,  Pulmonary embolism,  Death,  And possible reoperation.  Medical management risks include worsening of present situation.  The success of the procedure is 50 -90 % at treating patients symptoms.  The patient understands and agrees to proceed.  Description of procedure: The patient was met in the holding area and questions were answered.  He was taken back to the operating.  He is placed supine upon the OR table.  After induction of general esthesia a Foley catheter was placed under sterile  conditions and the abdomen was prepped and draped in sterile fashion.  Timeout was performed.  He received appropriate preoperative antibiotics.  Upper midline incision was used.  This was from the xiphoid to the umbilicus.  Dissection was carried down to the midline fascia and this was opened.  The abdominal cavity was entered.  The retractor was placed.  The stomach was palpated.  The foreign bodies could be easily felt.  A 4 cm anterior wall gastrotomy was made with cautery.  Upon entering the stomach there was no bleeding.  I was able to retrieve the ballpoint pen and passed it  off the field.  There were  THREE  plastic spoons  removed as well.  Tew were stacked and one white spoon. I palpated the stomach and there were no other foreign bodies.  The gastrorrhaphy was closed in 2 layers using 2-0 Vicryl.  Nasogastric tube was positioned appropriately.  The duodenum was then palpated and was normal.  Ligament of Treitz identified.  Small bowel was run from the ligament of Treitz to the ileocecal valve and there were no easily palpable foreign bodies.  The ascending colon, transverse colon, descending colon sigmoid colon and rectum were palpated and felt to be normal with no obvious foreign body that could be easily palpated.  There is no evidence of perforation or contamination of the peritoneal cavity.  The retractor was removed.  The fascia was then run closed with #1 PDS.  Staples applied.  Dry dressing placed.  All counts were found to be correct.  The patient was awoke extubated taken recovery  in satisfactory condition.

## 2019-08-12 NOTE — H&P (Signed)
History and Physical    Norman Vasquez ZOX:[id number] DOB: 1996-04-28 DOA: 08/11/2019  PCP: Patient, No Pcp Per  Patient coming from: [facility].  Chief Complaint: Foreign body ingestion.  HPI: Norman Vasquez is a 24 y.o. male with history of schizophrenia on Haldol and Cogentin intentionally swallowed a spoon and was brought to the ER.  Patient states he is suicidal.  Denies taking any drugs.  Denies any chest pain shortness of breath abdominal pain fever chills.  ED Course: In the ER Covid test was negative and chest x-ray shows foreign body.  X-ray of the soft neck tissue was unremarkable.  Dr. Christella Hartigan gastroenterology was consulted and patient underwent EGD and foreign body was visualized but was unable to be retrieved from the stomach and general surgery has been consulted.  Note that EGD showed no foreign bodies and the one that patient admitted to have taken.  Patient admitted for further management.  Metabolic panel was unremarkable CBC showed WBC of 12.5.  Review of Systems: As per HPI, rest all negative.   Past Medical History:  Diagnosis Date  . Schizophrenia [facility])     Past Surgical History:  Procedure Laterality Date  . INNER EAR SURGERY       reports that he has never smoked. He has never used smokeless tobacco. He reports previous alcohol use. No history on file for drug.  Allergies  Allergen Reactions  . Amoxicillin   . Penicillins     Did it involve swelling of the face/tongue/throat, SOB, or low BP? Unknown Did it involve sudden or severe rash/hives, skin peeling, or any reaction on the inside of your mouth or nose? Unknown Did you need to seek medical attention at a hospital or doctor's office? Unknown When did it last happen? If all above answers are "NO", may proceed with cephalosporin use.   . Tylenol [Acetaminophen]     Family History  Problem Relation Age of Onset  . Schizophrenia Neg Hx     Prior to Admission  medications   Not on File    Physical Exam: Constitutional: Moderately built and nourished. Vitals:   08/12/19 0200 08/12/19 0201 08/12/19 0230 08/12/19 0300  BP: (!) 132/93   114/76  Pulse: 85 88 84 79  Resp: 12 18 17 20   Temp: (!) 97.5 F (36.4 C)   98.3 F (36.8 C)  TempSrc:    Oral  SpO2: 97% 98% 100% 100%  Weight:      Height:       Eyes: Anicteric no pallor. ENMT: No discharge from the ears eyes nose or mouth. Neck: No mass felt.  No neck rigidity. Respiratory: No rhonchi or crepitations. Cardiovascular: S1-S2 heard. Abdomen: Soft nontender bowel sounds present. Musculoskeletal: No edema. Skin: No rash. Neurologic: Alert awake oriented to his name place and person.  Moves all extremities. Psychiatric: Admits to suicidal ideation.   Labs on Admission: I have personally reviewed following labs and imaging studies  CBC: Recent Labs  Lab 08/11/19 2125  WBC 12.5*  NEUTROABS 8.5*  HGB 14.5  HCT 45.9  MCV 93.9  PLT 368   Basic Metabolic Panel: Recent Labs  Lab 08/11/19 2125  NA 141  K 3.8  CL 110  CO2 23  GLUCOSE 89  BUN 11  CREATININE 0.73  CALCIUM 8.9   GFR: Estimated Creatinine Clearance: 165.5 mL/min (by C-G formula based on SCr of 0.73 mg/dL). Liver Function Tests: No results for input(s): AST, ALT, ALKPHOS, BILITOT, PROT,  ALBUMIN in the last 168 hours. No results for input(s): LIPASE, AMYLASE in the last 168 hours. No results for input(s): AMMONIA in the last 168 hours. Coagulation Profile: No results for input(s): INR, PROTIME in the last 168 hours. Cardiac Enzymes: No results for input(s): CKTOTAL, CKMB, CKMBINDEX, TROPONINI in the last 168 hours. BNP (last 3 results) No results for input(s): PROBNP in the last 8760 hours. HbA1C: No results for input(s): HGBA1C in the last 72 hours. CBG: No results for input(s): GLUCAP in the last 168 hours. Lipid Profile: No results for input(s): CHOL, HDL, LDLCALC, TRIG, CHOLHDL, LDLDIRECT in the last  72 hours. Thyroid Function Tests: No results for input(s): TSH, T4TOTAL, FREET4, T3FREE, THYROIDAB in the last 72 hours. Anemia Panel: No results for input(s): VITAMINB12, FOLATE, FERRITIN, TIBC, IRON, RETICCTPCT in the last 72 hours. Urine analysis: No results found for: COLORURINE, APPEARANCEUR, LABSPEC, PHURINE, GLUCOSEU, HGBUR, BILIRUBINUR, KETONESUR, PROTEINUR, UROBILINOGEN, NITRITE, LEUKOCYTESUR Sepsis Labs: @LABRCNTIP (procalcitonin:4,lacticidven:4) ) Recent Results (from the past 240 hour(s))  Respiratory Panel by RT PCR (Flu A&B, Covid) - Nasopharyngeal Swab     Status: None   Collection Time: 08/11/19  9:25 PM   Specimen: Nasopharyngeal Swab  Result Value Ref Range Status   SARS Coronavirus 2 by RT PCR NEGATIVE NEGATIVE Final    Comment: (NOTE) SARS-CoV-2 target nucleic acids are NOT DETECTED. The SARS-CoV-2 RNA is generally detectable in upper respiratoy specimens during the acute phase of infection. The lowest concentration of SARS-CoV-2 viral copies this assay can detect is 131 copies/mL. A negative result does not preclude SARS-Cov-2 infection and should not be used as the sole basis for treatment or other patient management decisions. A negative result may occur with  improper specimen collection/handling, submission of specimen other than nasopharyngeal swab, presence of viral mutation(s) within the areas targeted by this assay, and inadequate number of viral copies (<131 copies/mL). A negative result must be combined with clinical observations, patient history, and epidemiological information. The expected result is Negative. Fact Sheet for Patients:  PinkCheek.be Fact Sheet for Healthcare Providers:  GravelBags.it This test is not yet ap proved or cleared by the [place] FDA and  has been authorized for detection and/or diagnosis of SARS-CoV-2 by FDA under an Emergency Use Authorization (EUA). This EUA  will remain  in effect (meaning this test can be used) for the duration of the COVID-19 declaration under Section 564(b)(1) of the Act, 21 U.S.C. section 360bbb-3(b)(1), unless the authorization is terminated or revoked sooner.    Influenza A by PCR NEGATIVE NEGATIVE Final   Influenza B by PCR NEGATIVE NEGATIVE Final    Comment: (NOTE) The Xpert Xpress SARS-CoV-2/FLU/RSV assay is intended as an aid in  the diagnosis of influenza from Nasopharyngeal swab specimens and  should not be used as a sole basis for treatment. Nasal washings and  aspirates are unacceptable for Xpert Xpress SARS-CoV-2/FLU/RSV  testing. Fact Sheet for Patients: PinkCheek.be Fact Sheet for Healthcare Providers: GravelBags.it This test is not yet approved or cleared by the [place] FDA and  has been authorized for detection and/or diagnosis of SARS-CoV-2 by  FDA under an Emergency Use Authorization (EUA). This EUA will remain  in effect (meaning this test can be used) for the duration of the  Covid-19 declaration under Section 564(b)(1) of the Act, 21  U.S.C. section 360bbb-3(b)(1), unless the authorization is  terminated or revoked. Performed at [facility], [place] [address] [place], [place] [address]      Radiological Exams on  Admission: DG Neck Soft Tissue  Result Date: 08/11/2019 CLINICAL DATA:  Rule out foreign body. EXAM: NECK SOFT TISSUES - 1+ VIEW COMPARISON:  None. FINDINGS: There is no evidence of retropharyngeal soft tissue swelling or epiglottic enlargement. The cervical airway is unremarkable and no radio-opaque foreign body identified. IMPRESSION: Negative. Electronically Signed   By: Aram Candela M.D.   On: 08/11/2019 19:28   DG Chest Portable 1 View  Result Date: 08/11/2019 CLINICAL DATA:  Rule out foreign body. EXAM: PORTABLE CHEST 1 VIEW COMPARISON:  Oct 16, 2018 FINDINGS: The heart size and mediastinal  contours are within normal limits. Both lungs are clear. The visualized skeletal structures are unremarkable. A 6 mm radiopaque foreign body is seen overlying the right upper quadrant. This represents a new finding when compared to the prior study. IMPRESSION: 6 mm radiopaque foreign body overlying the right upper quadrant. This represents a new finding when compared to the prior study and may be consistent with interval cholecystectomy. Correlation with the patient's surgical history is recommended. Electronically Signed   By: Aram Candela M.D.   On: 08/11/2019 19:27     Assessment/Plan Principal Problem:   Foreign body in stomach Active Problems:   Suicidal ideations   Swallowed foreign body    1. Intentional ingestion of foreign body -as had undergone EGD unable to be retrieved out.  General surgery has been consulted and general surgery has requested CT abdomen pelvis which has been ordered.  We will keep n.p.o. in anticipation of surgery. 2. History of schizophrenia with suicidal ideation presently on suicide precautions.  Home medications needs to be verified.  Tylenol and salicylate levels are pending.  Given the patient will need surgery and close monitoring will need inpatient status.   DVT prophylaxis: SCDs.  Avoiding anticoagulation due to possible surgery. Code Status: Full code. Family Communication: Discussed with patient. Disposition Plan: To be determined. Consults called: Gastroenterologist and general surgery. Admission status: Inpatient.   Eduard Clos MD [facility] Pager [phone].  If 7PM-7AM, please contact night-coverage [email or web address] Password [facility]  08/12/2019, 4:27 AM

## 2019-08-12 NOTE — Progress Notes (Signed)
Patient seen and examined, admitted by Dr. Toniann Fail this morning   Briefly 24 year old male with history of schizophrenia on Haldol, Cogentin, currently in prison brought to ED for intentionally swallowing a spoon.  Patient ported that he was suicidal, denies taking any drugs.  Chest x-ray showed foreign body.  GI was consulted, patient underwent EGD and foreign body was visualized but unable to be retrieved. General surgery consulted.  BP 123/89 (BP Location: Right Arm)   Pulse 89   Temp 98.2 F (36.8 C) (Oral)   Resp 18   Ht 6\' 2"  (1.88 m)   Wt 90.7 kg   SpO2 100%   BMI 25.67 kg/m  Exam unremarkable  A/p Intentional ingestion of the foreign body -Underwent EGD, unable to retrieve the foreign body -CT abdomen pelvis showed at least 2 or possibly 3 radiolucent foreign bodies compatible with reported ingestion of plastic spoons, separate linear radiolucent foreign body with punctate radiodense tip likely a ballpoint pen in the gastric lumen no perforation -General surgery consulted, planning OR for laparotomy and retrieval of foreign bodies today  -N.p.o., IV fluids  Schizophrenia Currently stating suicidal ideations, will consult psychiatry    Sema Stangler M.D. [facility] 08/12/2019, 11:25 AM

## 2019-08-12 NOTE — Interval H&P Note (Signed)
History and Physical Interval Note:  08/12/2019 12:17 AM  Norman Vasquez  has presented today for surgery, with the diagnosis of foreign body.  The various methods of treatment have been discussed with the patient and family. After consideration of risks, benefits and other options for treatment, the patient has consented to  Procedure(s): ESOPHAGOGASTRODUODENOSCOPY (EGD) (N/A) FOREIGN BODY REMOVAL (N/A) as a surgical intervention.  The patient's history has been reviewed, patient examined, no change in status, stable for surgery.  I have reviewed the patient's chart and labs.  Questions were answered to the patient's satisfaction.     Rachael Fee

## 2019-08-12 NOTE — Anesthesia Procedure Notes (Signed)
Procedure Name: Intubation Date/Time: 08/12/2019 12:19 AM Performed by: Elyn Peers, CRNA Pre-anesthesia Checklist: Patient identified, Emergency Drugs available, Suction available, Patient being monitored and Timeout performed Patient Re-evaluated:Patient Re-evaluated prior to induction Oxygen Delivery Method: Circle system utilized Preoxygenation: Pre-oxygenation with 100% oxygen Induction Type: IV induction, Rapid sequence and Cricoid Pressure applied Laryngoscope Size: Miller and 3 Grade View: Grade I Tube type: Oral Tube size: 7.5 mm Number of attempts: 1 Airway Equipment and Method: Stylet Placement Confirmation: ETT inserted through vocal cords under direct vision,  positive ETCO2 and breath sounds checked- equal and bilateral Secured at: 23 cm Tube secured with: Tape Dental Injury: Teeth and Oropharynx as per pre-operative assessment  Comments: Streaking of blood noted to back of throat prior to intubation.

## 2019-08-12 NOTE — Consult Note (Signed)
Reason for Consult: Swallowed foreign body Referring Physician: Okey Dupre MD  Norman Vasquez is an 24 y.o. male.  HPI: Asked see patient at the request of Dr. Okey Dupre and Dr. Christella Hartigan of GI medicine secondary to patient swallowing plastic spoons and ballpoint pen.  He is a prisoner at a local jail.  He has schizophrenia.  He was brought to the emergency room yesterday after complaining of abdominal pain.  CT scan showed what appeared to be plastic spoons and a ballpoint pen in his stomach.  He underwent EGD earlier this morning by Dr. Christella Hartigan who found a ballpoint pen and what appeared to be a plastic spoon in the duodenum and pulled this back into the stomach.  He had what appeared to be a third plastic spoon in his stomach.  There was some ulceration noted in the duodenum.  He denies abdominal pain currently.  He has no nausea or vomiting and is very concerned about him being able to eat and drink.  He denies any other complaints.  Past Medical History:  Diagnosis Date  . Schizophrenia [facility])     Past Surgical History:  Procedure Laterality Date  . INNER EAR SURGERY      Family History  Problem Relation Age of Onset  . Schizophrenia Neg Hx     Social History:  reports that he has never smoked. He has never used smokeless tobacco. He reports previous alcohol use. No history on file for drug.  Allergies:  Allergies  Allergen Reactions  . Iodinated Diagnostic Agents Itching and Rash  . Amoxicillin   . Penicillins     Did it involve swelling of the face/tongue/throat, SOB, or low BP? Unknown Did it involve sudden or severe rash/hives, skin peeling, or any reaction on the inside of your mouth or nose? Unknown Did you need to seek medical attention at a hospital or doctor's office? Unknown When did it last happen? If all above answers are "NO", may proceed with cephalosporin use.   . Tylenol [Acetaminophen]     Medications: I have reviewed the patient's current  medications.  Results for orders placed or performed during the hospital encounter of 08/11/19 (from the past 48 hour(s))  CBC with Differential     Status: Abnormal   Collection Time: 08/11/19  9:25 PM  Result Value Ref Range   WBC 12.5 (H) 4.0 - 10.5 K/uL   RBC 4.89 4.22 - 5.81 MIL/uL   Hemoglobin 14.5 13.0 - 17.0 g/dL   HCT 79.0 38.3 - 33.8 %   MCV 93.9 80.0 - 100.0 fL   MCH 29.7 26.0 - 34.0 pg   MCHC 31.6 30.0 - 36.0 g/dL   RDW 32.9 19.1 - 66.0 %   Platelets 368 150 - 400 K/uL   nRBC 0.0 0.0 - 0.2 %   Neutrophils Relative % 68 %   Neutro Abs 8.5 (H) 1.7 - 7.7 K/uL   Lymphocytes Relative 22 %   Lymphs Abs 2.8 0.7 - 4.0 K/uL   Monocytes Relative 6 %   Monocytes Absolute 0.8 0.1 - 1.0 K/uL   Eosinophils Relative 3 %   Eosinophils Absolute 0.4 0.0 - 0.5 K/uL   Basophils Relative 1 %   Basophils Absolute 0.1 0.0 - 0.1 K/uL   Immature Granulocytes 0 %   Abs Immature Granulocytes 0.04 0.00 - 0.07 K/uL    Comment: Performed at [facility], [address], [place], [place] [address]  Basic metabolic panel     Status: None  Collection Time: 08/11/19  9:25 PM  Result Value Ref Range   Sodium 141 135 - 145 mmol/L   Potassium 3.8 3.5 - 5.1 mmol/L   Chloride 110 98 - 111 mmol/L   CO2 23 22 - 32 mmol/L   Glucose, Bld 89 70 - 99 mg/dL    Comment: Glucose reference range applies only to samples taken after fasting for at least 8 hours.   BUN 11 6 - 20 mg/dL   Creatinine, Ser 0.73 0.61 - 1.24 mg/dL   Calcium 8.9 8.9 - 10.3 mg/dL   GFR calc non Af Amer >60 >60 mL/min   GFR calc Af Amer >60 >60 mL/min   Anion gap 8 5 - 15    Comment: Performed at [facility], [address] [place], [place] [address]  Respiratory Panel by RT PCR (Flu A&B, Covid) - Nasopharyngeal Swab     Status: None   Collection Time: 08/11/19  9:25 PM   Specimen: Nasopharyngeal Swab  Result Value Ref Range   SARS Coronavirus 2 by RT PCR NEGATIVE NEGATIVE    Comment:  (NOTE) SARS-CoV-2 target nucleic acids are NOT DETECTED. The SARS-CoV-2 RNA is generally detectable in upper respiratoy specimens during the acute phase of infection. The lowest concentration of SARS-CoV-2 viral copies this assay can detect is 131 copies/mL. A negative result does not preclude SARS-Cov-2 infection and should not be used as the sole basis for treatment or other patient management decisions. A negative result may occur with  improper specimen collection/handling, submission of specimen other than nasopharyngeal swab, presence of viral mutation(s) within the areas targeted by this assay, and inadequate number of viral copies (<131 copies/mL). A negative result must be combined with clinical observations, patient history, and epidemiological information. The expected result is Negative. Fact Sheet for Patients:  https://www.fda.gov/media/142436/download Fact Sheet for Healthcare Providers:  https://www.fda.gov/media/142435/download This test is not yet ap proved or cleared by the [place] FDA and  has been authorized for detection and/or diagnosis of SARS-CoV-2 by FDA under an Emergency Use Authorization (EUA). This EUA will remain  in effect (meaning this test can be used) for the duration of the COVID-19 declaration under Section 564(b)(1) of the Act, 21 U.S.C. section 360bbb-3(b)(1), unless the authorization is terminated or revoked sooner.    Influenza A by PCR NEGATIVE NEGATIVE   Influenza B by PCR NEGATIVE NEGATIVE    Comment: (NOTE) The Xpert Xpress SARS-CoV-2/FLU/RSV assay is intended as an aid in  the diagnosis of influenza from Nasopharyngeal swab specimens and  should not be used as a sole basis for treatment. Nasal washings and  aspirates are unacceptable for Xpert Xpress SARS-CoV-2/FLU/RSV  testing. Fact Sheet for Patients: https://www.fda.gov/media/142436/download Fact Sheet for Healthcare Providers: https://www.fda.gov/media/142435/download This  test is not yet approved or cleared by the [place] FDA and  has been authorized for detection and/or diagnosis of SARS-CoV-2 by  FDA under an Emergency Use Authorization (EUA). This EUA will remain  in effect (meaning this test can be used) for the duration of the  Covid-19 declaration under Section 564(b)(1) of the Act, 21  U.S.C. section 360bbb-3(b)(1), unless the authorization is  terminated or revoked. Performed at  [facility], [address] [place], [place] [address]   Comprehensive metabolic panel     Status: Abnormal   Collection Time: 08/12/19  5:38 AM  Result Value Ref Range   Sodium 139 135 - 145 mmol/L   Potassium 4.4 3.5 - 5.1 mmol/L   Chloride   106 98 - 111 mmol/L   CO2 25 22 - 32 mmol/L   Glucose, Bld 121 (H) 70 - 99 mg/dL    Comment: Glucose reference range applies only to samples taken after fasting for at least 8 hours.   BUN 13 6 - 20 mg/dL   Creatinine, Ser 7.25 0.61 - 1.24 mg/dL   Calcium 9.2 8.9 - 36.6 mg/dL   Total Protein 8.0 6.5 - 8.1 g/dL   Albumin 3.6 3.5 - 5.0 g/dL   AST 41 15 - 41 U/L   ALT 80 (H) 0 - 44 U/L   Alkaline Phosphatase 63 38 - 126 U/L   Total Bilirubin 0.7 0.3 - 1.2 mg/dL   GFR calc non Af Amer >60 >60 mL/min   GFR calc Af Amer >60 >60 mL/min   Anion gap 8 5 - 15    Comment: Performed at [facility], [address], [place], [place] [address]  CBC WITH DIFFERENTIAL     Status: Abnormal   Collection Time: 08/12/19  5:38 AM  Result Value Ref Range   WBC 11.0 (H) 4.0 - 10.5 K/uL   RBC 4.84 4.22 - 5.81 MIL/uL   Hemoglobin 14.1 13.0 - 17.0 g/dL   HCT 74.2 59.5 - 63.8 %   MCV 91.5 80.0 - 100.0 fL   MCH 29.1 26.0 - 34.0 pg   MCHC 31.8 30.0 - 36.0 g/dL   RDW 75.6 43.3 - 29.5 %   Platelets 333 150 - 400 K/uL   nRBC 0.0 0.0 - 0.2 %   Neutrophils Relative % 88 %   Neutro Abs 9.6 (H) 1.7 - 7.7 K/uL   Lymphocytes Relative 11 %   Lymphs Abs 1.2 0.7 - 4.0 K/uL   Monocytes Relative 1 %   Monocytes  Absolute 0.1 0.1 - 1.0 K/uL   Eosinophils Relative 0 %   Eosinophils Absolute 0.0 0.0 - 0.5 K/uL   Basophils Relative 0 %   Basophils Absolute 0.0 0.0 - 0.1 K/uL   Immature Granulocytes 0 %   Abs Immature Granulocytes 0.04 0.00 - 0.07 K/uL    Comment: Performed at [facility], [address], [place], [place] [address]  Protime-INR     Status: None   Collection Time: 08/12/19  5:38 AM  Result Value Ref Range   Prothrombin Time 14.6 11.4 - 15.2 seconds   INR 1.2 0.8 - 1.2    Comment: (NOTE) INR goal varies based on device and disease states. Performed at [facility], [address] [place], [place] [address]   Type and screen [facility]     Status: None   Collection Time: 08/12/19  5:38 AM  Result Value Ref Range   ABO/RH(D) A POS    Antibody Screen NEG    Sample Expiration      08/15/2019,2359 Performed at [facility], [address], [place], [place] [address]   ABO/Rh     Status: None   Collection Time: 08/12/19  5:38 AM  Result Value Ref Range   ABO/RH(D)      A POS Performed at [facility], [address] [place], [place] [address]   Acetaminophen level     Status: Abnormal   Collection Time: 08/12/19  6:40 AM  Result Value Ref Range   Acetaminophen (Tylenol), Serum <10 (L) 10 - 30 ug/mL    Comment: Performed at [facility], [address] [place], [place]  [address]  Salicylate level     Status: Abnormal   Collection Time: 08/12/19  6:40 AM  Result Value Ref Range   Salicylate Lvl <7.0 (L) 7.0 - 30.0 mg/dL    Comment: Performed at [facility], [address] [place], [place] [address]    CT ABDOMEN PELVIS WO CONTRAST  Result Date: 08/12/2019 CLINICAL DATA:  Gastric foreign body EXAM: CT ABDOMEN AND PELVIS WITHOUT CONTRAST TECHNIQUE: Multidetector CT imaging of the abdomen and pelvis was performed following the standard  protocol without IV contrast. COMPARISON:  None. FINDINGS: Lower chest: Lung bases are clear. Normal heart size. No pericardial effusion. Hepatobiliary: No visible or contour deforming hepatic lesions. Smooth liver surface contour. Incidental note is made of a small amount of gas in the common bile duct as well as a layering air-fluid level within the gallbladder. No frank gallbladder wall thickening or pericholecystic fluid is seen. No visible calcified gallstones either within the gallbladder lumen or in the biliary tree. Pancreas: Unremarkable. No pancreatic ductal dilatation or surrounding inflammatory changes. Spleen: Normal in size without focal abnormality. Adrenals/Urinary Tract: Adrenal glands are unremarkable. Kidneys are normal, without renal calculi, focal lesion, or hydronephrosis. Bladder is unremarkable. Stomach/Bowel: Distal esophagus is unremarkable. There are at least 2, possibly 3 radiolucent foreign bodies compatible with reported ingestion of plastic spoons which appear contained within the gastric body. Two of these may be stacked or this may be artifactual related to motion artifact. Immediately adjacent is a more elongated linear foreign body with a radiodense tip which may reflect a ball point pen, assessment of the foreign body is complicated by motion artifact secondary to gastric peristalsis. Foreign bodies appear pre pyloric at this time. No distal foreign bodies are evident though small radiolucent foreign bodies may be difficult to assess. Duodenum takes a normal sweep across the abdomen. No small bowel dilatation or wall thickening. A normal appendix is visualized. No colonic dilatation or wall thickening. Vascular/Lymphatic: The aorta is normal caliber. No suspicious or enlarged lymph nodes in the included lymphatic chains. Reproductive: The prostate and seminal vesicles are unremarkable. Other: No evidence of abdominopelvic free air or free fluid. No features to suggest a hollow  viscus perforation at this time Musculoskeletal: No acute osseous abnormality or suspicious osseous lesion. Transitional lumbosacral anatomy. IMPRESSION: 1. There are at least 2, possibly 3 radiolucent foreign bodies compatible with reported ingestion of plastic spoons which appear contained within the gastric body. Two of these may be stacked or this may be artifactual related to motion artifact. 2. Separate linear radiolucent foreign body with punctate radiodense tip likely a ball point pen also within the gastric lumen. 3. No evidence of hollow viscus perforation. No distal foreign bodies are evident at this time. 4. Incidental note is made of a small amount of gas in the common bile duct as well as a layering air-fluid level within the gallbladder. Correlate for recent manipulation. Electronically Signed   By: Kreg Shropshire M.D.   On: 08/12/2019 05:41   DG Neck Soft Tissue  Result Date: 08/11/2019 CLINICAL DATA:  Rule out foreign body. EXAM: NECK SOFT TISSUES - 1+ VIEW COMPARISON:  None. FINDINGS: There is no evidence of retropharyngeal soft tissue swelling or epiglottic enlargement. The cervical airway is unremarkable and no radio-opaque foreign body identified. IMPRESSION: Negative. Electronically Signed   By: Aram Candela M.D.   On: 08/11/2019 19:28   DG Chest Portable 1 View  Result Date: 08/11/2019 CLINICAL DATA:  Rule out foreign body.  EXAM: PORTABLE CHEST 1 VIEW COMPARISON:  Oct 16, 2018 FINDINGS: The heart size and mediastinal contours are within normal limits. Both lungs are clear. The visualized skeletal structures are unremarkable. A 6 mm radiopaque foreign body is seen overlying the right upper quadrant. This represents a new finding when compared to the prior study. IMPRESSION: 6 mm radiopaque foreign body overlying the right upper quadrant. This represents a new finding when compared to the prior study and may be consistent with interval cholecystectomy. Correlation with the patient's  surgical history is recommended. Electronically Signed   By: Virgina Norfolk M.D.   On: 08/11/2019 19:27    Review of Systems  Constitutional: Negative.   HENT: Negative.   Eyes: Negative.   Respiratory: Negative.   Cardiovascular: Negative.   Gastrointestinal: Negative.   Endocrine: Negative.   Genitourinary: Negative.   Musculoskeletal: Negative.   Allergic/Immunologic: Negative.   Neurological: Negative.   Hematological: Negative.   Psychiatric/Behavioral: Positive for hallucinations, self-injury and suicidal ideas. Negative for agitation.   Blood pressure 125/85, pulse 82, temperature 98.7 F (37.1 C), temperature source Oral, resp. rate 20, height 6\' 2"  (1.88 m), weight 90.7 kg, SpO2 99 %. Physical Exam  Constitutional: He is oriented to person, place, and time. He appears well-developed and well-nourished. No distress.  HENT:  Right Ear: External ear normal.  Left Ear: External ear normal.  Nose: Nose normal.  Mouth/Throat: Oropharynx is clear and moist.  Eyes: Pupils are equal, round, and reactive to light. No scleral icterus.  Neck: No JVD present.  Cardiovascular: Normal rate and regular rhythm.  No murmur heard. Respiratory: Effort normal and breath sounds normal. He has no wheezes.  GI: Soft. He exhibits no distension and no mass. There is no abdominal tenderness. There is no rebound and no guarding.  Musculoskeletal:        General: No tenderness or edema. Normal range of motion.     Cervical back: Normal range of motion and neck supple.  Neurological: He is alert and oriented to person, place, and time. GCS eye subscore is 4. GCS verbal subscore is 5. GCS motor subscore is 6.  Skin: Skin is warm and dry. No rash noted. He is not diaphoretic.  Psychiatric: He has a normal mood and affect. His speech is normal and behavior is normal. Thought content normal. Cognition and memory are normal. He expresses impulsivity.    Assessment/Plan: Ingestion of foreign  body-he has failed endoscopic treatment of this.  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  He does have schizophrenia and has had suicidal ideation in the past.  I talk with him about surgery.  He understands the rationale for surgery.  He appears to be able to understand this and does not appear delusional at this time.  Given failure of endoscopic treatment, he will require laparotomy and enterotomy to remove what appeared to be less exposed and a ballpoint pen.  There may be other foreign bodies and explained that to him as well.  He is only admitted if swelling these 3 things but there may be others.  I reviewed his CT scan as well and Dr. Ardis Hughs note.  Plan for OR later this morning.  No signs of peritonitis at this point time.The procedure has been discussed with the patient.  Alternative therapies have been discussed with the patient.  Operative risks include bleeding,  Infection,  Organ injury,  Nerve injury,  Blood vessel injury,  DVT,  Pulmonary embolism,  Death,  And possible reoperation.  Medical management risks include worsening of present situation.  The success of the procedure is 50 -90 % at treating patients symptoms.  The patient understands and agrees to proceed.  Schizophrenia-recommend psychiatry consultation.  Gurjit Loconte A Yeraldi Fidler 08/12/2019, 7:39 AM

## 2019-08-12 NOTE — Op Note (Addendum)
[facility] Patient Name: Norman Vasquez Procedure Date: 08/11/2019 MRN: [id number] Attending MD: Rachael Fee , MD Date of Birth: 09/09/1995 CSN: [id number] Age: 24 Admit Type: Emergency Department Procedure:                Upper GI endoscopy Indications:              He said he swallowed a long plastic spoon today Providers:                Rachael Fee, MD, Vicki Mallet, RN, Lawson Radar, Technician, Everardo Pacific, Technician, Elyn Peers CRNA, CRNA Referring MD:             Gwendalyn Ege, PA in ER at [facility] Medicines:                General Anesthesia Complications:            No immediate complications. Estimated blood loss:                            None. Estimated Blood Loss:     Estimated blood loss: none. Procedure:                After obtaining informed consent, the endoscope was                            passed under direct vision. Throughout the                            procedure, the patient's blood pressure, pulse, and                            oxygen saturations were monitored continuously. The                            GIF-H190 ([id number]) Olympus gastroscope was                            introduced through the mouth, and advanced to the                            second part of duodenum. The upper GI endoscopy was                            accomplished without difficulty. The patient                            tolerated the procedure well. Scope In: Scope Out: Findings:      The esophagus was normal.      There was a clean white plastic spoon in the proximal stomach.      There were three other long thin objects in the duodenum. Eventually I  could tell these objects were two more plastic spoons (stained brown, I       suspect they had been in the duodenum for a while) and a pen (felt       tip?). These three objects had caused signficant trauma to the duodenum       with a  large ulcer in the bulb and several smaller ulcers more distally.       The tip of the pen was partially embedded into the duodenal bulb mucosa.       Using a snare I was able to move the three duodenal objects into his       stomach. I then placed an overtube over the gastroscope and attempted to       remove the objects from his stomach however I could not orient them       successfully to be pulled closer to the overtube or into the esophagus       and so procedure was aborted. Impression:               - Four foreign bodies in his UGI tract (three                            plastic spoons and one felt tip pen). Three of the                            objects were lodged in his duodenum and had caused                            significant ulcerations there. Currently all four                            objects are in his stomach however I was unable to                            safely remove them any further. Moderate Sedation:      Not Applicable - Patient had care per Anesthesia. Recommendation:           - I spoke to [facility]. They plan to see him in the                            morning.                           - I spoke with [facility] who will be                            admitting him, ordering CT scan abd/pelvis to check                            for evidence of more foreign bodies more distally                            in his GI tract since he only admitted to  swallowing a single spoon. Scan will also give                            information about the severity of the duodenal                            foreign body related ulcers.                           - PPI BID to help heal the ulcers.                           - [facility] to follow peripherally for now. Procedure Code(s):        --- Professional ---                           [phone], Esophagogastroduodenoscopy, flexible,                            transoral; with biopsy,  single or multiple Diagnosis Code(s):        --- Professional ---                           K25.9, Gastric ulcer, unspecified as acute or                            chronic, without hemorrhage or perforation                           T18.9XXA, Foreign body of alimentary tract, part                            unspecified, initial encounter CPT copyright 2019 American Medical Association. All rights reserved. The codes documented in this report are preliminary and upon coder review may  be revised to meet current compliance requirements. Milus Banister, MD 08/12/2019 1:45:52 AM This report has been signed electronically. Number of Addenda: 0

## 2019-08-12 NOTE — Transfer of Care (Signed)
Immediate Anesthesia Transfer of Care Note  Patient: Norman Vasquez  Procedure(s) Performed: EXPLORATORY LAPAROTOMY , GASTROTOMY, REMOVAL OF FOREIGN BODIES (N/A Abdomen)  Patient Location: PACU  Anesthesia Type:General  Level of Consciousness: awake and alert   Airway & Oxygen Therapy: Patient Spontanous Breathing and Patient connected to face mask oxygen  Post-op Assessment: Report given to RN and Post -op Vital signs reviewed and stable  Post vital signs: Reviewed and stable  Last Vitals:  Vitals Value Taken Time  BP 96/72 08/12/19 1228  Temp    Pulse 95 08/12/19 1230  Resp    SpO2 100 % 08/12/19 1230  Vitals shown include unvalidated device data.  Last Pain:  Vitals:   08/12/19 1015  TempSrc:   PainSc: 0-No pain      Patients Stated Pain Goal: 0 (08/12/19 0344)  Complications: No apparent anesthesia complications

## 2019-08-12 NOTE — Anesthesia Postprocedure Evaluation (Signed)
Anesthesia Post Note  Patient: Norman Vasquez  Procedure(s) Performed: EXPLORATORY LAPAROTOMY , GASTRorrhaphy, REMOVAL OF FOREIGN BODIES (N/A Abdomen)     Patient location during evaluation: PACU Anesthesia Type: General Level of consciousness: awake Pain management: pain level controlled Vital Signs Assessment: post-procedure vital signs reviewed and stable Respiratory status: spontaneous breathing Cardiovascular status: stable Postop Assessment: no apparent nausea or vomiting Anesthetic complications: no    Last Vitals:  Vitals:   08/12/19 1315 08/12/19 1330  BP: (!) 151/93 (!) 148/94  Pulse: 77 84  Resp: 13 14  Temp:  36.7 C  SpO2: 100% 100%    Last Pain:  Vitals:   08/12/19 1330  TempSrc:   PainSc: Asleep                 Azizi Bally

## 2019-08-12 NOTE — Interval H&P Note (Signed)
History and Physical Interval Note:  08/12/2019 10:45 AM  Norman Vasquez  has presented today for surgery, with the diagnosis of swallowed foreign objects.  The various methods of treatment have been discussed with the patient and family. After consideration of risks, benefits and other options for treatment, the patient has consented to  Procedure(s): EXPLORATORY LAPAROTOMY (N/A) as a surgical intervention.  The patient's history has been reviewed, patient examined, no change in status, stable for surgery.  I have reviewed the patient's chart and labs.  Questions were answered to the patient's satisfaction.     Tara Rud A Tyshia Fenter

## 2019-08-12 NOTE — Plan of Care (Signed)
°  Problem: Coping: °Goal: Level of anxiety will decrease °Outcome: Progressing °  °

## 2019-08-12 NOTE — Anesthesia Preprocedure Evaluation (Addendum)
Anesthesia Evaluation  Patient identified by MRN, date of birth, ID band Patient awake    Reviewed: Allergy & Precautions, NPO status , Patient's Chart, lab work & pertinent test results  Airway Mallampati: II  TM Distance: >3 FB     Dental   Pulmonary    breath sounds clear to auscultation       Cardiovascular negative cardio ROS   Rhythm:Regular Rate:Normal     Neuro/Psych    GI/Hepatic Neg liver ROS, History noted CG   Endo/Other  negative endocrine ROS  Renal/GU negative Renal ROS     Musculoskeletal   Abdominal   Peds  Hematology   Anesthesia Other Findings   Reproductive/Obstetrics                             Anesthesia Physical Anesthesia Plan  ASA: II  Anesthesia Plan: General   Post-op Pain Management:    Induction: Intravenous, Rapid sequence and Cricoid pressure planned  PONV Risk Score and Plan: 2 and Ondansetron, Dexamethasone and Midazolam  Airway Management Planned: Oral ETT  Additional Equipment:   Intra-op Plan:   Post-operative Plan:   Informed Consent: I have reviewed the patients History and Physical, chart, labs and discussed the procedure including the risks, benefits and alternatives for the proposed anesthesia with the patient or authorized representative who has indicated his/her understanding and acceptance.     Dental advisory given  Plan Discussed with: CRNA and Anesthesiologist  Anesthesia Plan Comments:         Anesthesia Quick Evaluation

## 2019-08-12 NOTE — Progress Notes (Signed)
Patient is back in room from procedure. He comes back w/ the following:  Honeycomb dressing in place on incision site.  PCA pump. Foley Cath.

## 2019-08-12 NOTE — Anesthesia Procedure Notes (Addendum)
Procedure Name: Intubation Date/Time: 08/12/2019 11:10 AM Performed by: Eben Burow, CRNA Pre-anesthesia Checklist: Patient identified, Emergency Drugs available, Suction available, Patient being monitored and Timeout performed Patient Re-evaluated:Patient Re-evaluated prior to induction Oxygen Delivery Method: Circle system utilized Preoxygenation: Pre-oxygenation with 100% oxygen Induction Type: IV induction and Rapid sequence Laryngoscope Size: Mac and 4 Grade View: Grade I Tube type: Oral Tube size: 7.5 mm Number of attempts: 1 Airway Equipment and Method: Stylet Placement Confirmation: ETT inserted through vocal cords under direct vision,  positive ETCO2 and breath sounds checked- equal and bilateral Secured at: 23 cm Tube secured with: Tape Dental Injury: Teeth and Oropharynx as per pre-operative assessment

## 2019-08-12 NOTE — Progress Notes (Signed)
Unable to see, in PACU.  Nanine Means, PMHNP

## 2019-08-12 NOTE — H&P (View-Only) (Signed)
Reason for Consult: Swallowed foreign body Referring Physician: Okey Dupre MD  Norman Vasquez is an 24 y.o. male.  HPI: Asked see patient at the request of Dr. Okey Dupre and Dr. Christella Hartigan of GI medicine secondary to patient swallowing plastic spoons and ballpoint pen.  He is a prisoner at a local jail.  He has schizophrenia.  He was brought to the emergency room yesterday after complaining of abdominal pain.  CT scan showed what appeared to be plastic spoons and a ballpoint pen in his stomach.  He underwent EGD earlier this morning by Dr. Christella Hartigan who found a ballpoint pen and what appeared to be a plastic spoon in the duodenum and pulled this back into the stomach.  He had what appeared to be a third plastic spoon in his stomach.  There was some ulceration noted in the duodenum.  He denies abdominal pain currently.  He has no nausea or vomiting and is very concerned about him being able to eat and drink.  He denies any other complaints.  Past Medical History:  Diagnosis Date  . Schizophrenia [facility])     Past Surgical History:  Procedure Laterality Date  . INNER EAR SURGERY      Family History  Problem Relation Age of Onset  . Schizophrenia Neg Hx     Social History:  reports that he has never smoked. He has never used smokeless tobacco. He reports previous alcohol use. No history on file for drug.  Allergies:  Allergies  Allergen Reactions  . Iodinated Diagnostic Agents Itching and Rash  . Amoxicillin   . Penicillins     Did it involve swelling of the face/tongue/throat, SOB, or low BP? Unknown Did it involve sudden or severe rash/hives, skin peeling, or any reaction on the inside of your mouth or nose? Unknown Did you need to seek medical attention at a hospital or doctor's office? Unknown When did it last happen? If all above answers are "NO", may proceed with cephalosporin use.   . Tylenol [Acetaminophen]     Medications: I have reviewed the patient's current  medications.  Results for orders placed or performed during the hospital encounter of 08/11/19 (from the past 48 hour(s))  CBC with Differential     Status: Abnormal   Collection Time: 08/11/19  9:25 PM  Result Value Ref Range   WBC 12.5 (H) 4.0 - 10.5 K/uL   RBC 4.89 4.22 - 5.81 MIL/uL   Hemoglobin 14.5 13.0 - 17.0 g/dL   HCT 79.0 38.3 - 33.8 %   MCV 93.9 80.0 - 100.0 fL   MCH 29.7 26.0 - 34.0 pg   MCHC 31.6 30.0 - 36.0 g/dL   RDW 32.9 19.1 - 66.0 %   Platelets 368 150 - 400 K/uL   nRBC 0.0 0.0 - 0.2 %   Neutrophils Relative % 68 %   Neutro Abs 8.5 (H) 1.7 - 7.7 K/uL   Lymphocytes Relative 22 %   Lymphs Abs 2.8 0.7 - 4.0 K/uL   Monocytes Relative 6 %   Monocytes Absolute 0.8 0.1 - 1.0 K/uL   Eosinophils Relative 3 %   Eosinophils Absolute 0.4 0.0 - 0.5 K/uL   Basophils Relative 1 %   Basophils Absolute 0.1 0.0 - 0.1 K/uL   Immature Granulocytes 0 %   Abs Immature Granulocytes 0.04 0.00 - 0.07 K/uL    Comment: Performed at [facility], [address], [place], [place] [address]  Basic metabolic panel     Status: None  Collection Time: 08/11/19  9:25 PM  Result Value Ref Range   Sodium 141 135 - 145 mmol/L   Potassium 3.8 3.5 - 5.1 mmol/L   Chloride 110 98 - 111 mmol/L   CO2 23 22 - 32 mmol/L   Glucose, Bld 89 70 - 99 mg/dL    Comment: Glucose reference range applies only to samples taken after fasting for at least 8 hours.   BUN 11 6 - 20 mg/dL   Creatinine, Ser 1.30 0.61 - 1.24 mg/dL   Calcium 8.9 8.9 - 86.5 mg/dL   GFR calc non Af Amer >60 >60 mL/min   GFR calc Af Amer >60 >60 mL/min   Anion gap 8 5 - 15    Comment: Performed at [facility], [address] [place], [place] [address]  Respiratory Panel by RT PCR (Flu A&B, Covid) - Nasopharyngeal Swab     Status: None   Collection Time: 08/11/19  9:25 PM   Specimen: Nasopharyngeal Swab  Result Value Ref Range   SARS Coronavirus 2 by RT PCR NEGATIVE NEGATIVE    Comment:  (NOTE) SARS-CoV-2 target nucleic acids are NOT DETECTED. The SARS-CoV-2 RNA is generally detectable in upper respiratoy specimens during the acute phase of infection. The lowest concentration of SARS-CoV-2 viral copies this assay can detect is 131 copies/mL. A negative result does not preclude SARS-Cov-2 infection and should not be used as the sole basis for treatment or other patient management decisions. A negative result may occur with  improper specimen collection/handling, submission of specimen other than nasopharyngeal swab, presence of viral mutation(s) within the areas targeted by this assay, and inadequate number of viral copies (<131 copies/mL). A negative result must be combined with clinical observations, patient history, and epidemiological information. The expected result is Negative. Fact Sheet for Patients:  https://www.moore.com/ Fact Sheet for Healthcare Providers:  https://www.young.biz/ This test is not yet ap proved or cleared by the [place] FDA and  has been authorized for detection and/or diagnosis of SARS-CoV-2 by FDA under an Emergency Use Authorization (EUA). This EUA will remain  in effect (meaning this test can be used) for the duration of the COVID-19 declaration under Section 564(b)(1) of the Act, 21 U.S.C. section 360bbb-3(b)(1), unless the authorization is terminated or revoked sooner.    Influenza A by PCR NEGATIVE NEGATIVE   Influenza B by PCR NEGATIVE NEGATIVE    Comment: (NOTE) The Xpert Xpress SARS-CoV-2/FLU/RSV assay is intended as an aid in  the diagnosis of influenza from Nasopharyngeal swab specimens and  should not be used as a sole basis for treatment. Nasal washings and  aspirates are unacceptable for Xpert Xpress SARS-CoV-2/FLU/RSV  testing. Fact Sheet for Patients: https://www.moore.com/ Fact Sheet for Healthcare Providers: https://www.young.biz/ This  test is not yet approved or cleared by the [place] FDA and  has been authorized for detection and/or diagnosis of SARS-CoV-2 by  FDA under an Emergency Use Authorization (EUA). This EUA will remain  in effect (meaning this test can be used) for the duration of the  Covid-19 declaration under Section 564(b)(1) of the Act, 21  U.S.C. section 360bbb-3(b)(1), unless the authorization is  terminated or revoked. Performed at [facility], [address] [place], [place] [address]   Comprehensive metabolic panel     Status: Abnormal   Collection Time: 08/12/19  5:38 AM  Result Value Ref Range   Sodium 139 135 - 145 mmol/L   Potassium 4.4 3.5 - 5.1 mmol/L   Chloride  106 98 - 111 mmol/L   CO2 25 22 - 32 mmol/L   Glucose, Bld 121 (H) 70 - 99 mg/dL    Comment: Glucose reference range applies only to samples taken after fasting for at least 8 hours.   BUN 13 6 - 20 mg/dL   Creatinine, Ser 7.25 0.61 - 1.24 mg/dL   Calcium 9.2 8.9 - 36.6 mg/dL   Total Protein 8.0 6.5 - 8.1 g/dL   Albumin 3.6 3.5 - 5.0 g/dL   AST 41 15 - 41 U/L   ALT 80 (H) 0 - 44 U/L   Alkaline Phosphatase 63 38 - 126 U/L   Total Bilirubin 0.7 0.3 - 1.2 mg/dL   GFR calc non Af Amer >60 >60 mL/min   GFR calc Af Amer >60 >60 mL/min   Anion gap 8 5 - 15    Comment: Performed at [facility], [address], [place], [place] [address]  CBC WITH DIFFERENTIAL     Status: Abnormal   Collection Time: 08/12/19  5:38 AM  Result Value Ref Range   WBC 11.0 (H) 4.0 - 10.5 K/uL   RBC 4.84 4.22 - 5.81 MIL/uL   Hemoglobin 14.1 13.0 - 17.0 g/dL   HCT 74.2 59.5 - 63.8 %   MCV 91.5 80.0 - 100.0 fL   MCH 29.1 26.0 - 34.0 pg   MCHC 31.8 30.0 - 36.0 g/dL   RDW 75.6 43.3 - 29.5 %   Platelets 333 150 - 400 K/uL   nRBC 0.0 0.0 - 0.2 %   Neutrophils Relative % 88 %   Neutro Abs 9.6 (H) 1.7 - 7.7 K/uL   Lymphocytes Relative 11 %   Lymphs Abs 1.2 0.7 - 4.0 K/uL   Monocytes Relative 1 %   Monocytes  Absolute 0.1 0.1 - 1.0 K/uL   Eosinophils Relative 0 %   Eosinophils Absolute 0.0 0.0 - 0.5 K/uL   Basophils Relative 0 %   Basophils Absolute 0.0 0.0 - 0.1 K/uL   Immature Granulocytes 0 %   Abs Immature Granulocytes 0.04 0.00 - 0.07 K/uL    Comment: Performed at [facility], [address], [place], [place] [address]  Protime-INR     Status: None   Collection Time: 08/12/19  5:38 AM  Result Value Ref Range   Prothrombin Time 14.6 11.4 - 15.2 seconds   INR 1.2 0.8 - 1.2    Comment: (NOTE) INR goal varies based on device and disease states. Performed at [facility], [address] [place], [place] [address]   Type and screen [facility]     Status: None   Collection Time: 08/12/19  5:38 AM  Result Value Ref Range   ABO/RH(D) A POS    Antibody Screen NEG    Sample Expiration      08/15/2019,2359 Performed at [facility], [address], [place], [place] [address]   ABO/Rh     Status: None   Collection Time: 08/12/19  5:38 AM  Result Value Ref Range   ABO/RH(D)      A POS Performed at [facility], [address] [place], [place] [address]   Acetaminophen level     Status: Abnormal   Collection Time: 08/12/19  6:40 AM  Result Value Ref Range   Acetaminophen (Tylenol), Serum <10 (L) 10 - 30 ug/mL    Comment: Performed at [facility], [address] [place], [place]  [address]  Salicylate level     Status: Abnormal   Collection Time: 08/12/19  6:40 AM  Result Value Ref Range   Salicylate Lvl <7.0 (L) 7.0 - 30.0 mg/dL    Comment: Performed at [facility], [address] [place], [place] [address]    CT ABDOMEN PELVIS WO CONTRAST  Result Date: 08/12/2019 CLINICAL DATA:  Gastric foreign body EXAM: CT ABDOMEN AND PELVIS WITHOUT CONTRAST TECHNIQUE: Multidetector CT imaging of the abdomen and pelvis was performed following the standard  protocol without IV contrast. COMPARISON:  None. FINDINGS: Lower chest: Lung bases are clear. Normal heart size. No pericardial effusion. Hepatobiliary: No visible or contour deforming hepatic lesions. Smooth liver surface contour. Incidental note is made of a small amount of gas in the common bile duct as well as a layering air-fluid level within the gallbladder. No frank gallbladder wall thickening or pericholecystic fluid is seen. No visible calcified gallstones either within the gallbladder lumen or in the biliary tree. Pancreas: Unremarkable. No pancreatic ductal dilatation or surrounding inflammatory changes. Spleen: Normal in size without focal abnormality. Adrenals/Urinary Tract: Adrenal glands are unremarkable. Kidneys are normal, without renal calculi, focal lesion, or hydronephrosis. Bladder is unremarkable. Stomach/Bowel: Distal esophagus is unremarkable. There are at least 2, possibly 3 radiolucent foreign bodies compatible with reported ingestion of plastic spoons which appear contained within the gastric body. Two of these may be stacked or this may be artifactual related to motion artifact. Immediately adjacent is a more elongated linear foreign body with a radiodense tip which may reflect a ball point pen, assessment of the foreign body is complicated by motion artifact secondary to gastric peristalsis. Foreign bodies appear pre pyloric at this time. No distal foreign bodies are evident though small radiolucent foreign bodies may be difficult to assess. Duodenum takes a normal sweep across the abdomen. No small bowel dilatation or wall thickening. A normal appendix is visualized. No colonic dilatation or wall thickening. Vascular/Lymphatic: The aorta is normal caliber. No suspicious or enlarged lymph nodes in the included lymphatic chains. Reproductive: The prostate and seminal vesicles are unremarkable. Other: No evidence of abdominopelvic free air or free fluid. No features to suggest a hollow  viscus perforation at this time Musculoskeletal: No acute osseous abnormality or suspicious osseous lesion. Transitional lumbosacral anatomy. IMPRESSION: 1. There are at least 2, possibly 3 radiolucent foreign bodies compatible with reported ingestion of plastic spoons which appear contained within the gastric body. Two of these may be stacked or this may be artifactual related to motion artifact. 2. Separate linear radiolucent foreign body with punctate radiodense tip likely a ball point pen also within the gastric lumen. 3. No evidence of hollow viscus perforation. No distal foreign bodies are evident at this time. 4. Incidental note is made of a small amount of gas in the common bile duct as well as a layering air-fluid level within the gallbladder. Correlate for recent manipulation. Electronically Signed   By: Kreg Shropshire M.D.   On: 08/12/2019 05:41   DG Neck Soft Tissue  Result Date: 08/11/2019 CLINICAL DATA:  Rule out foreign body. EXAM: NECK SOFT TISSUES - 1+ VIEW COMPARISON:  None. FINDINGS: There is no evidence of retropharyngeal soft tissue swelling or epiglottic enlargement. The cervical airway is unremarkable and no radio-opaque foreign body identified. IMPRESSION: Negative. Electronically Signed   By: Aram Candela M.D.   On: 08/11/2019 19:28   DG Chest Portable 1 View  Result Date: 08/11/2019 CLINICAL DATA:  Rule out foreign body.  EXAM: PORTABLE CHEST 1 VIEW COMPARISON:  Oct 16, 2018 FINDINGS: The heart size and mediastinal contours are within normal limits. Both lungs are clear. The visualized skeletal structures are unremarkable. A 6 mm radiopaque foreign body is seen overlying the right upper quadrant. This represents a new finding when compared to the prior study. IMPRESSION: 6 mm radiopaque foreign body overlying the right upper quadrant. This represents a new finding when compared to the prior study and may be consistent with interval cholecystectomy. Correlation with the patient's  surgical history is recommended. Electronically Signed   By: Virgina Norfolk M.D.   On: 08/11/2019 19:27    Review of Systems  Constitutional: Negative.   HENT: Negative.   Eyes: Negative.   Respiratory: Negative.   Cardiovascular: Negative.   Gastrointestinal: Negative.   Endocrine: Negative.   Genitourinary: Negative.   Musculoskeletal: Negative.   Allergic/Immunologic: Negative.   Neurological: Negative.   Hematological: Negative.   Psychiatric/Behavioral: Positive for hallucinations, self-injury and suicidal ideas. Negative for agitation.   Blood pressure 125/85, pulse 82, temperature 98.7 F (37.1 C), temperature source Oral, resp. rate 20, height 6\' 2"  (1.88 m), weight 90.7 kg, SpO2 99 %. Physical Exam  Constitutional: He is oriented to person, place, and time. He appears well-developed and well-nourished. No distress.  HENT:  Right Ear: External ear normal.  Left Ear: External ear normal.  Nose: Nose normal.  Mouth/Throat: Oropharynx is clear and moist.  Eyes: Pupils are equal, round, and reactive to light. No scleral icterus.  Neck: No JVD present.  Cardiovascular: Normal rate and regular rhythm.  No murmur heard. Respiratory: Effort normal and breath sounds normal. He has no wheezes.  GI: Soft. He exhibits no distension and no mass. There is no abdominal tenderness. There is no rebound and no guarding.  Musculoskeletal:        General: No tenderness or edema. Normal range of motion.     Cervical back: Normal range of motion and neck supple.  Neurological: He is alert and oriented to person, place, and time. GCS eye subscore is 4. GCS verbal subscore is 5. GCS motor subscore is 6.  Skin: Skin is warm and dry. No rash noted. He is not diaphoretic.  Psychiatric: He has a normal mood and affect. His speech is normal and behavior is normal. Thought content normal. Cognition and memory are normal. He expresses impulsivity.    Assessment/Plan: Ingestion of foreign  body-he has failed endoscopic treatment of this.  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  He does have schizophrenia and has had suicidal ideation in the past.  I talk with him about surgery.  He understands the rationale for surgery.  He appears to be able to understand this and does not appear delusional at this time.  Given failure of endoscopic treatment, he will require laparotomy and enterotomy to remove what appeared to be less exposed and a ballpoint pen.  There may be other foreign bodies and explained that to him as well.  He is only admitted if swelling these 3 things but there may be others.  I reviewed his CT scan as well and Dr. Ardis Hughs note.  Plan for OR later this morning.  No signs of peritonitis at this point time.The procedure has been discussed with the patient.  Alternative therapies have been discussed with the patient.  Operative risks include bleeding,  Infection,  Organ injury,  Nerve injury,  Blood vessel injury,  DVT,  Pulmonary embolism,  Death,  And possible reoperation.  Medical management risks include worsening of present situation.  The success of the procedure is 50 -90 % at treating patients symptoms.  The patient understands and agrees to proceed.  Schizophrenia-recommend psychiatry consultation.  Telitha Plath A Ceairra Mccarver 08/12/2019, 7:39 AM

## 2019-08-13 DIAGNOSIS — T182XXA Foreign body in stomach, initial encounter: Principal | ICD-10-CM

## 2019-08-13 LAB — CBC
HCT: 43.3 % (ref 39.0–52.0)
Hemoglobin: 14.1 g/dL (ref 13.0–17.0)
MCH: 29.3 pg (ref 26.0–34.0)
MCHC: 32.6 g/dL (ref 30.0–36.0)
MCV: 89.8 fL (ref 80.0–100.0)
Platelets: 318 10*3/uL (ref 150–400)
RBC: 4.82 MIL/uL (ref 4.22–5.81)
RDW: 13 % (ref 11.5–15.5)
WBC: 13.4 10*3/uL — ABNORMAL HIGH (ref 4.0–10.5)
nRBC: 0 % (ref 0.0–0.2)

## 2019-08-13 LAB — COMPREHENSIVE METABOLIC PANEL
ALT: 61 U/L — ABNORMAL HIGH (ref 0–44)
AST: 28 U/L (ref 15–41)
Albumin: 3.5 g/dL (ref 3.5–5.0)
Alkaline Phosphatase: 59 U/L (ref 38–126)
Anion gap: 10 (ref 5–15)
BUN: 10 mg/dL (ref 6–20)
CO2: 26 mmol/L (ref 22–32)
Calcium: 8.8 mg/dL — ABNORMAL LOW (ref 8.9–10.3)
Chloride: 104 mmol/L (ref 98–111)
Creatinine, Ser: 0.64 mg/dL (ref 0.61–1.24)
GFR calc Af Amer: >60 mL/min (ref >60)
GFR calc non Af Amer: >60 mL/min (ref >60)
Glucose, Bld: 97 mg/dL (ref 70–99)
Potassium: 3.9 mmol/L (ref 3.5–5.1)
Sodium: 140 mmol/L (ref 135–145)
Total Bilirubin: 1 mg/dL (ref 0.3–1.2)
Total Protein: 7.5 g/dL (ref 6.5–8.1)

## 2019-08-13 MED ORDER — METOPROLOL TARTRATE 5 MG/5ML IV SOLN
5.0000 mg | INTRAVENOUS | Status: AC | PRN
  Administered 2019-08-13: 5 mg via INTRAVENOUS

## 2019-08-13 MED ORDER — KCL-LACTATED RINGERS-D5W 20 MEQ/L IV SOLN
INTRAVENOUS | Status: AC
  Filled 2019-08-13 (×2): qty 1000

## 2019-08-13 MED ORDER — KETOROLAC TROMETHAMINE 15 MG/ML IJ SOLN
15.0000 mg | Freq: Four times a day (QID) | INTRAMUSCULAR | Status: AC
  Administered 2019-08-13 (×2): 15 mg via INTRAVENOUS
  Filled 2019-08-13 (×2): qty 1

## 2019-08-13 MED ORDER — METOPROLOL TARTRATE 5 MG/5ML IV SOLN
INTRAVENOUS | Status: AC
  Filled 2019-08-13: qty 5

## 2019-08-13 NOTE — Progress Notes (Signed)
BP stayed elevated overnight. On call notified regarding HTN. Metropolol given, one time dose only. Pt has no other orders for elevated BP. Med ineffective, BP still remains elevated. MEWS still green. PCA encouraged. Incentive Spirometer encouraged. Pt up to bedside commode due to foley leaking. Foley advanced. Pt now dry per Tech. Will pass on to day RN.

## 2019-08-13 NOTE — Progress Notes (Signed)
Patient refused labs to be drawn this morning. Lab will be back later today to attempt again.

## 2019-08-13 NOTE — Progress Notes (Signed)
1 Day Post-Op    CC: Foreign body ingestion  Subjective: Patient is postop day #1 is lying in bed NG is still putting out a fair amount.  No bowel sounds, no flatus or BM.  Midline incision looks fine.  Objective: Vital signs in last 24 hours: Temp:  [97.5 F (36.4 C)-98.2 F (36.8 C)] 97.6 F (36.4 C) (03/16 0558) Pulse Rate:  [65-95] 65 (03/16 0558) Resp:  [12-20] 20 (03/16 0558) BP: (96-151)/(72-108) 139/108 (03/16 0558) SpO2:  [100 %] 100 % (03/16 0558) Weight:  [90.7 kg] 90.7 kg (03/15 1015) Last BM Date: 08/11/19 4139 IV Nothing p.o. recorded Urine 575 NG 1050 Afebrile blood pressure is 140/100 range. CMP is stable glucose 121, ALT 80 WBC 11.0 H/H 14.1/44.3, platelets 333,000 Tylenol level less than 10 Salicylate level less than 7.0 Intake/Output from previous day: 03/15 0701 - 03/16 0700 In: 4139 [I.V.:3239; IV Piggyback:900] Out: 1645 [Urine:575; Emesis/NG output:1050; Blood:20] Intake/Output this shift: No intake/output data recorded.  General appearance: alert, cooperative and no distress Resp: clear to auscultation bilaterally GI: Complaining of pain on a PCA.  Midline incision looks fine.  No bowel sounds no BM.  Ongoing NG drainage.  Lab Results:  Recent Labs    08/11/19 2125 08/12/19 0538  WBC 12.5* 11.0*  HGB 14.5 14.1  HCT 45.9 44.3  PLT 368 333    BMET Recent Labs    08/11/19 2125 08/12/19 0538  NA 141 139  K 3.8 4.4  CL 110 106  CO2 23 25  GLUCOSE 89 121*  BUN 11 13  CREATININE 0.73 0.90  CALCIUM 8.9 9.2   PT/INR Recent Labs    08/12/19 0538  LABPROT 14.6  INR 1.2    Recent Labs  Lab 08/12/19 0538  AST 41  ALT 80*  ALKPHOS 63  BILITOT 0.7  PROT 8.0  ALBUMIN 3.6     Lipase  No results found for: LIPASE   Medications: . Chlorhexidine Gluconate Cloth  6 each Topical Daily  . enoxaparin (LOVENOX) injection  40 mg Subcutaneous Q24H  . HYDROmorphone   Intravenous Q4H  . pantoprazole (PROTONIX) IV  40 mg  Intravenous Q12H   . ciprofloxacin Stopped (08/13/19 0117)  . methocarbamol (ROBAXIN) IV 1,000 mg (08/13/19 0601)  . metronidazole Stopped (08/13/19 0252)    Assessment/Plan Incarcerated Hx schizophrenia  Ingestion of multiple foreign bodies Exploratory laparotomy with gastrotomy,gastrorrhaphy and removal of foreign bodies (ball point pen and 3 spoons) 08/12/2019, Dr. Maisie Fus Cornett POD #1  FEN: N.p.o./no IV fluids listed ID: Cipro/Flagyl 3/15 >> day 2 DVT: Lovenox Follow-up: Dr. Luisa Hart  Plan: We will get him out of bed today up in the chair.  Continue NG drainage.,  Start mobilizing.  Await bowel function.  Plan upper GI postop day #3.     LOS: 1 day    Eraina Winnie 08/13/2019 Please see Amion

## 2019-08-13 NOTE — Progress Notes (Signed)
PROGRESS NOTE  Norman Vasquez TDH:[id number] DOB: December 04, 1995 DOA: 08/11/2019 PCP: Patient, No Pcp Per   Brief summary:  Norman Vasquez is a 24 y.o. male with history of schizophrenia on Haldol and Cogentin intentionally swallowed a spoon and was brought to the ER.  Patient states he is suicidal.  Denies taking any drugs.  Denies any chest pain shortness of breath abdominal pain fever chills  HPI/Recap of past 24 hours:  Post ex lap day 1, on NG suction, 1 L output documented last 24 hours Since its sitter in room Guards in room  Assessment/Plan: Principal Problem:   Foreign body in stomach Active Problems:   Suicidal ideations   Swallowed foreign body  Intentional ingestion of the foreign body -Underwent EGD, unable to retrieve the foreign body -CT abdomen pelvis showed at least 2 or possibly 3 radiolucent foreign bodies compatible with reported ingestion of plastic spoons, separate linear radiolucent foreign body with punctate radiodense tip likely a ballpoint pen in the gastric lumen no perforation -s/p ex laparotomy and retrieval of foreign bodies on 3/15, on NG suction, iv cipro and flagyl, dilaudid pca pump plan per general surgery   Schizophrenia   Currently denies suicidal ideations Psychiatry consult ordered by Dr. Tana Coast Awaiting recommendation  DVT Prophylaxis: Lovenox  Code Status: Full  Family Communication: patient   Disposition Plan:    Patient came from:                     [facility]ison                                                                                      Anticipated d/c place: TBD  Barriers to d/c OR conditions which need to be met to effect a safe d/c:  Medically not ready to discharge, remain on NG suction, need general surgery clearance, need psychiatry clearance   Consultants:  GI  General surgery  Psychiatry  Procedures:   EGD on March 15  Ex lap on March 15  Antibiotics:  IV Cipro and Flagyl   Objective: BP (!)  138/95 (BP Location: Left Arm)   Pulse 70   Temp 97.6 F (36.4 C) (Oral)   Resp 18   Ht 6' 2"  (1.88 m)   Wt 90.7 kg   SpO2 100%   BMI 25.67 kg/m   Intake/Output Summary (Last 24 hours) at 08/13/2019 1543 Last data filed at 08/13/2019 1505 Gross per 24 hour  Intake 2524.02 ml  Output 1425 ml  Net 1099.02 ml   Filed Weights   08/12/19 0010 08/12/19 1015  Weight: 90.7 kg 90.7 kg    Exam: Patient is examined daily including today on 08/13/2019, exams remain the same as of yesterday except that has changed    General:  NAD, on NG suction, PCA pump  Cardiovascular: RRR  Respiratory: CTABL  Abdomen: Postop changes  Musculoskeletal: No Edema  Neuro: alert, oriented   Data Reviewed: Basic Metabolic Panel: Recent Labs  Lab 08/11/19 2125 08/12/19 0538 08/13/19 1440  NA 141 139 140  K 3.8 4.4 3.9  CL 110 106 104  CO2 23 25 26   GLUCOSE 89 121*  97  BUN 11 13 10   CREATININE 0.73 0.90 0.64  CALCIUM 8.9 9.2 8.8*   Liver Function Tests: Recent Labs  Lab 08/12/19 0538 08/13/19 1440  AST 41 28  ALT 80* 61*  ALKPHOS 63 59  BILITOT 0.7 1.0  PROT 8.0 7.5  ALBUMIN 3.6 3.5   No results for input(s): LIPASE, AMYLASE in the last 168 hours. No results for input(s): AMMONIA in the last 168 hours. CBC: Recent Labs  Lab 08/11/19 2125 08/12/19 0538 08/13/19 1440  WBC 12.5* 11.0* 13.4*  NEUTROABS 8.5* 9.6*  --   HGB 14.5 14.1 14.1  HCT 45.9 44.3 43.3  MCV 93.9 91.5 89.8  PLT 368 333 318   Cardiac Enzymes:   No results for input(s): CKTOTAL, CKMB, CKMBINDEX, TROPONINI in the last 168 hours. BNP (last 3 results) No results for input(s): BNP in the last 8760 hours.  ProBNP (last 3 results) No results for input(s): PROBNP in the last 8760 hours.  CBG: No results for input(s): GLUCAP in the last 168 hours.  Recent Results (from the past 240 hour(s))  Respiratory Panel by RT PCR (Flu A&B, Covid) - Nasopharyngeal Swab     Status: None   Collection Time: 08/11/19   9:25 PM   Specimen: Nasopharyngeal Swab  Result Value Ref Range Status   SARS Coronavirus 2 by RT PCR NEGATIVE NEGATIVE Final    Comment: (NOTE) SARS-CoV-2 target nucleic acids are NOT DETECTED. The SARS-CoV-2 RNA is generally detectable in upper respiratoy specimens during the acute phase of infection. The lowest concentration of SARS-CoV-2 viral copies this assay can detect is 131 copies/mL. A negative result does not preclude SARS-Cov-2 infection and should not be used as the sole basis for treatment or other patient management decisions. A negative result may occur with  improper specimen collection/handling, submission of specimen other than nasopharyngeal swab, presence of viral mutation(s) within the areas targeted by this assay, and inadequate number of viral copies (<131 copies/mL). A negative result must be combined with clinical observations, patient history, and epidemiological information. The expected result is Negative. Fact Sheet for Patients:  PinkCheek.be Fact Sheet for Healthcare Providers:  GravelBags.it This test is not yet ap proved or cleared by the [place] FDA and  has been authorized for detection and/or diagnosis of SARS-CoV-2 by FDA under an Emergency Use Authorization (EUA). This EUA will remain  in effect (meaning this test can be used) for the duration of the COVID-19 declaration under Section 564(b)(1) of the Act, 21 U.S.C. section 360bbb-3(b)(1), unless the authorization is terminated or revoked sooner.    Influenza A by PCR NEGATIVE NEGATIVE Final   Influenza B by PCR NEGATIVE NEGATIVE Final    Comment: (NOTE) The Xpert Xpress SARS-CoV-2/FLU/RSV assay is intended as an aid in  the diagnosis of influenza from Nasopharyngeal swab specimens and  should not be used as a sole basis for treatment. Nasal washings and  aspirates are unacceptable for Xpert Xpress SARS-CoV-2/FLU/RSV   testing. Fact Sheet for Patients: PinkCheek.be Fact Sheet for Healthcare Providers: GravelBags.it This test is not yet approved or cleared by the [place] FDA and  has been authorized for detection and/or diagnosis of SARS-CoV-2 by  FDA under an Emergency Use Authorization (EUA). This EUA will remain  in effect (meaning this test can be used) for the duration of the  Covid-19 declaration under Section 564(b)(1) of the Act, 21  U.S.C. section 360bbb-3(b)(1), unless the authorization is  terminated or revoked. Performed at [facility], [place] [address], [place], [place] [address]      Studies: No results found.  Scheduled Meds: . Chlorhexidine Gluconate Cloth  6 each Topical Daily  . enoxaparin (LOVENOX) injection  40 mg Subcutaneous Q24H  . HYDROmorphone   Intravenous Q4H  . ketorolac  15 mg Intravenous Q6H  . pantoprazole (PROTONIX) IV  40 mg Intravenous Q12H    Continuous Infusions: . ciprofloxacin 400 mg (08/13/19 1115)  . methocarbamol (ROBAXIN) IV 1,000 mg (08/13/19 1505)  . metronidazole 500 mg (08/13/19 1005)     Time spent: 49mns I have personally reviewed and interpreted on  08/13/2019 daily labs, tele strips, imagings as discussed above under date review session and assessment and plans.  I reviewed all nursing notes, pharmacy notes, consultant notes,  vitals, pertinent old records  I have discussed plan of care as described above with RN , patient on 08/13/2019   FFlorencia ReasonsMD, PhD, FACP  [facility]  Available via Epic secure chat 7am-7pm for nonurgent issues Please page for urgent issues, pager number available through [email or web address] .   08/13/2019, 3:43 PM  LOS: 1 day

## 2019-08-13 NOTE — Anesthesia Postprocedure Evaluation (Signed)
Anesthesia Post Note  Patient: Norman Vasquez  Procedure(s) Performed: ESOPHAGOGASTRODUODENOSCOPY (EGD) (N/A ) FOREIGN BODY REMOVAL (N/A )     Patient location during evaluation: PACU Anesthesia Type: General Level of consciousness: awake and alert Pain management: pain level controlled Vital Signs Assessment: post-procedure vital signs reviewed and stable Respiratory status: spontaneous breathing, nonlabored ventilation, respiratory function stable and patient connected to nasal cannula oxygen Cardiovascular status: blood pressure returned to baseline and stable Postop Assessment: no apparent nausea or vomiting Anesthetic complications: no    Last Vitals:  Vitals:   08/13/19 0558 08/13/19 0846  BP: (!) 139/108   Pulse: 65   Resp: 20 16  Temp: 36.4 C   SpO2: 100% 100%    Last Pain:  Vitals:   08/13/19 0849  TempSrc:   PainSc: 8                  Trevor Iha

## 2019-08-14 DIAGNOSIS — F209 Schizophrenia, unspecified: Secondary | ICD-10-CM

## 2019-08-14 LAB — CBC WITH DIFFERENTIAL/PLATELET
Abs Immature Granulocytes: 0.05 10*3/uL (ref 0.00–0.07)
Basophils Absolute: 0 10*3/uL (ref 0.0–0.1)
Basophils Relative: 0 %
Eosinophils Absolute: 0.1 10*3/uL (ref 0.0–0.5)
Eosinophils Relative: 1 %
HCT: 43 % (ref 39.0–52.0)
Hemoglobin: 14.1 g/dL (ref 13.0–17.0)
Immature Granulocytes: 0 %
Lymphocytes Relative: 20 %
Lymphs Abs: 2.5 10*3/uL (ref 0.7–4.0)
MCH: 29.5 pg (ref 26.0–34.0)
MCHC: 32.8 g/dL (ref 30.0–36.0)
MCV: 90 fL (ref 80.0–100.0)
Monocytes Absolute: 1 10*3/uL (ref 0.1–1.0)
Monocytes Relative: 8 %
Neutro Abs: 8.8 10*3/uL — ABNORMAL HIGH (ref 1.7–7.7)
Neutrophils Relative %: 71 %
Platelets: 292 10*3/uL (ref 150–400)
RBC: 4.78 MIL/uL (ref 4.22–5.81)
RDW: 12.9 % (ref 11.5–15.5)
WBC: 12.5 10*3/uL — ABNORMAL HIGH (ref 4.0–10.5)
nRBC: 0 % (ref 0.0–0.2)

## 2019-08-14 LAB — COMPREHENSIVE METABOLIC PANEL
ALT: 58 U/L — ABNORMAL HIGH (ref 0–44)
AST: 30 U/L (ref 15–41)
Albumin: 3.3 g/dL — ABNORMAL LOW (ref 3.5–5.0)
Alkaline Phosphatase: 53 U/L (ref 38–126)
Anion gap: 8 (ref 5–15)
BUN: 11 mg/dL (ref 6–20)
CO2: 26 mmol/L (ref 22–32)
Calcium: 8.9 mg/dL (ref 8.9–10.3)
Chloride: 102 mmol/L (ref 98–111)
Creatinine, Ser: 0.83 mg/dL (ref 0.61–1.24)
GFR calc Af Amer: >60 mL/min (ref >60)
GFR calc non Af Amer: >60 mL/min (ref >60)
Glucose, Bld: 111 mg/dL — ABNORMAL HIGH (ref 70–99)
Potassium: 3.9 mmol/L (ref 3.5–5.1)
Sodium: 136 mmol/L (ref 135–145)
Total Bilirubin: 0.8 mg/dL (ref 0.3–1.2)
Total Protein: 7.2 g/dL (ref 6.5–8.1)

## 2019-08-14 LAB — MAGNESIUM: Magnesium: 1.9 mg/dL (ref 1.7–2.4)

## 2019-08-14 LAB — TSH: TSH: 1.499 u[IU]/mL (ref 0.350–4.500)

## 2019-08-14 MED ORDER — LORAZEPAM 1 MG PO TABS: 2.0000 mg | ORAL_TABLET | Freq: Every day | ORAL | Status: DC | PRN

## 2019-08-14 MED ORDER — HALOPERIDOL 5 MG PO TABS
5.0000 mg | ORAL_TABLET | Freq: Every day | ORAL | Status: DC
  Filled 2019-08-14 (×3): qty 1

## 2019-08-14 NOTE — Progress Notes (Signed)
Yellow MEWS page sent to Dr. Maisie Fus surgeon.  No response at the end of my shift from Dr. Sharolyn Douglas or Dr. Maisie Fus.

## 2019-08-14 NOTE — Consult Note (Signed)
[facility] Consult   Reason for Consult:  "Schizophrenia, suicidal, ingesting multiple foreign bodies, unsuccessful EGD, now undergoing laparotomy." Referring Physician:   Patient Identification: Norman Vasquez MRN:  [id number] Principal Diagnosis: Foreign body in stomach Diagnosis:  Principal Problem:   Foreign body in stomach Active Problems:   Suicidal ideations   Swallowed foreign body   Total Time spent with patient: 30 minutes  Subjective:   Norman Vasquez is a 24 y.o. male patient.  Patient assessed by nurse practitioner.  Patient alert and oriented, answers appropriately. Patient states "I swallowed some spoons and a pen in an effort to injure myself."  Patient denies ingestion was a suicide attempt.  Patient reports history of cutting, states "I injure myself to make me feel better." Patient denies suicidal and homicidal ideations today.  Patient reports history of approximately 15 suicide attempts.  Patient denies auditory and visual hallucinations at this time.  Patient reports "I am a level 3 schizophrenic."  Patient denies symptoms of paranoia today. Patient reports he currently resides in the [facility], patient was in the jail when he ingested the foreign bodies.  Patient reports he is currently jailed for multiple charges including robbery with a dangerous weapon as well as concealed firearm and possession of methamphetamine charges.  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  When not in incarcerated patient is homeless but stays in hotels at times.  Patient seen by outpatient [facility] not [facility].   [facility]- there for 46 months related to felony larceny and felony possession."   Patient reports at [place] jail he is treated with Haldol however patient reports he prefers clozapine. Patient currently in  custody of police, visualized with handcuffs to arm and foot at this time. Patient gives verbal consent to speak with fianc, Susann Givens phone number [phone].  HPI: Patient admitted with intentional ingestion of multiple foreign bodies.  Past Psychiatric History: Patient reports history of schizophrenia.  Risk to Self:  Denies Risk to Others:  Denies Prior Inpatient Therapy:  Yes Prior Outpatient Therapy:  Yes  Past Medical History:  Past Medical History:  Diagnosis Date  . Schizophrenia [facility])     Past Surgical History:  Procedure Laterality Date  . ESOPHAGOGASTRODUODENOSCOPY N/A 08/12/2019   Procedure: ESOPHAGOGASTRODUODENOSCOPY (EGD);  Surgeon: Milus Banister, MD;  Location: [facility];  Service: Endoscopy;  Laterality: N/A;  . FOREIGN BODY REMOVAL N/A 08/12/2019   Procedure: FOREIGN BODY REMOVAL;  Surgeon: Milus Banister, MD;  Location: [facility] ENDOSCOPY;  Service: Endoscopy;  Laterality: N/A;  . INNER EAR SURGERY    . LAPAROTOMY N/A 08/12/2019   Procedure: EXPLORATORY LAPAROTOMY , GASTRorrhaphy, REMOVAL OF FOREIGN BODIES;  Surgeon: Erroll Luna, MD;  Location: [facility] ORS;  Service: General;  Laterality: N/A;   Family History:  Family History  Problem Relation Age of Onset  . Schizophrenia Neg Hx    Family Psychiatric  History: Unknown Social History:  Social History   Substance and Sexual Activity  Alcohol Use Not Currently     Social History   Substance and Sexual Activity  Drug Use Not on file    Social History   Socioeconomic History  . Marital status: Single    Spouse name: Not on file  . Number of children: Not on file  . Years of education: Not on file  . Highest education level: Not on file  Occupational History  .  Not on file  Tobacco Use  . Smoking status: Never Smoker  . Smokeless tobacco: Never Used  Substance and Sexual Activity  . Alcohol use: Not Currently  . Drug use: Not on file  . Sexual activity: Not on file  Other Topics  Concern  . Not on file  Social History Narrative  . Not on file   Social Determinants of Health   Financial Resource Strain:   . Difficulty of Paying Living Expenses:   Food Insecurity:   . Worried About Programme researcher, broadcasting/film/video in the Last Year:   . Barista in the Last Year:   Transportation Needs:   . Freight forwarder (Medical):   Marland Kitchen Lack of Transportation (Non-Medical):   Physical Activity:   . Days of Exercise per Week:   . Minutes of Exercise per Session:   Stress:   . Feeling of Stress :   Social Connections:   . Frequency of Communication with Friends and Family:   . Frequency of Social Gatherings with Friends and Family:   . Attends Religious Services:   . Active Member of Clubs or Organizations:   . Attends Banker Meetings:   Marland Kitchen Marital Status:    Additional Social History:    Allergies:   Allergies  Allergen Reactions  . Iodinated Diagnostic Agents Itching and Rash  . Amoxicillin   . Penicillins     Did it involve swelling of the face/tongue/throat, SOB, or low BP? Unknown Did it involve sudden or severe rash/hives, skin peeling, or any reaction on the inside of your mouth or nose? Unknown Did you need to seek medical attention at a hospital or doctor's office? Unknown When did it last happen? If all above answers are "NO", may proceed with cephalosporin use.   . Tylenol [Acetaminophen]     Labs:  Results for orders placed or performed during the hospital encounter of 08/11/19 (from the past 48 hour(s))  CBC     Status: Abnormal   Collection Time: 08/13/19  2:40 PM  Result Value Ref Range   WBC 13.4 (H) 4.0 - 10.5 K/uL   RBC 4.82 4.22 - 5.81 MIL/uL   Hemoglobin 14.1 13.0 - 17.0 g/dL   HCT 37.6 28.3 - 15.1 %   MCV 89.8 80.0 - 100.0 fL   MCH 29.3 26.0 - 34.0 pg   MCHC 32.6 30.0 - 36.0 g/dL   RDW 76.1 60.7 - 37.1 %   Platelets 318 150 - 400 K/uL   nRBC 0.0 0.0 - 0.2 %    Comment: Performed at [facility], [address], [place], [place] [address]  Comprehensive metabolic panel     Status: Abnormal   Collection Time: 08/13/19  2:40 PM  Result Value Ref Range   Sodium 140 135 - 145 mmol/L   Potassium 3.9 3.5 - 5.1 mmol/L   Chloride 104 98 - 111 mmol/L   CO2 26 22 - 32 mmol/L   Glucose, Bld 97 70 - 99 mg/dL    Comment: Glucose reference range applies only to samples taken after fasting for at least 8 hours.   BUN 10 6 - 20 mg/dL   Creatinine, Ser 4.85 0.61 - 1.24 mg/dL   Calcium 8.8 (L) 8.9 - 10.3 mg/dL   Total Protein 7.5 6.5 - 8.1 g/dL   Albumin 3.5 3.5 - 5.0 g/dL   AST 28 15 - 41 U/L   ALT 61 (H) 0 - 44 U/L  Alkaline Phosphatase 59 38 - 126 U/L   Total Bilirubin 1.0 0.3 - 1.2 mg/dL   GFR calc non Af Amer >60 >60 mL/min   GFR calc Af Amer >60 >60 mL/min   Anion gap 10 5 - 15    Comment: Performed at [facility], [address], [place], [place] [address]  CBC with Differential/Platelet     Status: Abnormal   Collection Time: 08/14/19  5:33 AM  Result Value Ref Range   WBC 12.5 (H) 4.0 - 10.5 K/uL   RBC 4.78 4.22 - 5.81 MIL/uL   Hemoglobin 14.1 13.0 - 17.0 g/dL   HCT 09.3 11.2 - 16.2 %   MCV 90.0 80.0 - 100.0 fL   MCH 29.5 26.0 - 34.0 pg   MCHC 32.8 30.0 - 36.0 g/dL   RDW 44.6 95.0 - 72.2 %   Platelets 292 150 - 400 K/uL   nRBC 0.0 0.0 - 0.2 %   Neutrophils Relative % 71 %   Neutro Abs 8.8 (H) 1.7 - 7.7 K/uL   Lymphocytes Relative 20 %   Lymphs Abs 2.5 0.7 - 4.0 K/uL   Monocytes Relative 8 %   Monocytes Absolute 1.0 0.1 - 1.0 K/uL   Eosinophils Relative 1 %   Eosinophils Absolute 0.1 0.0 - 0.5 K/uL   Basophils Relative 0 %   Basophils Absolute 0.0 0.0 - 0.1 K/uL   Immature Granulocytes 0 %   Abs Immature Granulocytes 0.05 0.00 - 0.07 K/uL    Comment: Performed at [facility], [address] [place], [place] [address]  Comprehensive metabolic panel     Status: Abnormal   Collection Time: 08/14/19  5:33 AM  Result  Value Ref Range   Sodium 136 135 - 145 mmol/L   Potassium 3.9 3.5 - 5.1 mmol/L   Chloride 102 98 - 111 mmol/L   CO2 26 22 - 32 mmol/L   Glucose, Bld 111 (H) 70 - 99 mg/dL    Comment: Glucose reference range applies only to samples taken after fasting for at least 8 hours.   BUN 11 6 - 20 mg/dL   Creatinine, Ser 1.83 0.61 - 1.24 mg/dL   Calcium 8.9 8.9 - 35.8 mg/dL   Total Protein 7.2 6.5 - 8.1 g/dL   Albumin 3.3 (L) 3.5 - 5.0 g/dL   AST 30 15 - 41 U/L   ALT 58 (H) 0 - 44 U/L   Alkaline Phosphatase 53 38 - 126 U/L   Total Bilirubin 0.8 0.3 - 1.2 mg/dL   GFR calc non Af Amer >60 >60 mL/min   GFR calc Af Amer >60 >60 mL/min   Anion gap 8 5 - 15    Comment: Performed at [facility], [address], [place], [place] [address]  Magnesium     Status: None   Collection Time: 08/14/19  5:33 AM  Result Value Ref Range   Magnesium 1.9 1.7 - 2.4 mg/dL    Comment: Performed at [facility], [address], [place], [place] [address]  TSH     Status: None   Collection Time: 08/14/19  5:33 AM  Result Value Ref Range   TSH 1.499 0.350 - 4.500 uIU/mL    Comment: Performed by a 3rd Generation assay with a functional sensitivity of <=0.01 uIU/mL. Performed at [facility], [address], [place], [place] [address]     Current Facility-Administered Medications  Medication Dose Route Frequency Provider Last  Rate Last Admin  . Chlorhexidine Gluconate Cloth 2 % PADS 6 each  6 each Topical Daily Rai, Ripudeep K, MD   6 each at 08/13/19 1005  . ciprofloxacin (CIPRO) IVPB 400 mg  400 mg Intravenous Q12H Rayburn, Kelly A, PA-C   Stopped at 08/14/19 0052  . dextrose 5% in lactated ringers with KCl 20 mEq/L infusion   Intravenous Continuous Albertine Grates, MD 75 mL/hr at 08/13/19 1636 New Bag at 08/13/19 1636  . diphenhydrAMINE (BENADRYL) injection 12.5 mg  12.5 mg Intravenous Q6H PRN Rayburn, Kelly A, PA-C       Or  . diphenhydrAMINE (BENADRYL) 12.5  MG/5ML elixir 12.5 mg  12.5 mg Oral Q6H PRN Rayburn, Kelly A, PA-C      . enoxaparin (LOVENOX) injection 40 mg  40 mg Subcutaneous Q24H Rayburn, Kelly A, PA-C   40 mg at 08/14/19 1045  . haloperidol lactate (HALDOL) injection 5 mg  5 mg Intravenous Q6H PRN Rayburn, Alphonsus Sias, PA-C      . HYDROmorphone (DILAUDID) 1 mg/mL PCA injection   Intravenous Q4H Rayburn, Kelly A, PA-C   30 mg at 08/14/19 1055  . methocarbamol (ROBAXIN) 1,000 mg in dextrose 5 % 100 mL IVPB  1,000 mg Intravenous Q8H Rayburn, Kelly A, PA-C 200 mL/hr at 08/14/19 0626 1,000 mg at 08/14/19 0626  . metroNIDAZOLE (FLAGYL) IVPB 500 mg  500 mg Intravenous Q8H Rayburn, Kelly A, PA-C 100 mL/hr at 08/14/19 1044 500 mg at 08/14/19 1044  . naloxone (NARCAN) injection 0.4 mg  0.4 mg Intravenous PRN Rayburn, Kelly A, PA-C       And  . sodium chloride flush (NS) 0.9 % injection 9 mL  9 mL Intravenous PRN Rayburn, Kelly A, PA-C      . ondansetron (ZOFRAN) tablet 4 mg  4 mg Oral Q6H PRN Rayburn, Kelly A, PA-C       Or  . ondansetron (ZOFRAN) injection 4 mg  4 mg Intravenous Q6H PRN Rayburn, Kelly A, PA-C      . ondansetron (ZOFRAN) injection 4 mg  4 mg Intravenous Q6H PRN Rayburn, Kelly A, PA-C      . pantoprazole (PROTONIX) injection 40 mg  40 mg Intravenous Q12H Rayburn, Kelly A, PA-C   40 mg at 08/14/19 1045    Musculoskeletal: Strength & Muscle Tone: unable to assess  Gait & Station: unable to assess Patient leans: N/A  Psychiatric Specialty Exam: Physical Exam  Review of Systems  Blood pressure (!) 142/91, pulse 97, temperature 97.7 F (36.5 C), temperature source Oral, resp. rate 13, height 6\' 2"  (1.88 m), weight 90.7 kg, SpO2 98 %.Body mass index is 25.67 kg/m.  General Appearance: Fairly Groomed  Eye Contact:  Fair  Speech:  Clear and Coherent and Normal Rate  Volume:  Normal  Mood:  Depressed  Affect:  Depressed  Thought Process:  Coherent, Goal Directed and Descriptions of Associations: Intact  Orientation:  Full (Time,  Place, and Person)  Thought Content:  WDL  Suicidal Thoughts:  No  Homicidal Thoughts:  No  Memory:  Immediate;   Good Recent;   Good Remote;   Good  Judgement:  Fair  Insight:  Fair  Psychomotor Activity:  Normal  Concentration:  Concentration: Good and Attention Span: Good  Recall:  Good  Fund of Knowledge:  Good  Language:  Good  Akathisia:  No  Handed:  Right  AIMS (if indicated):     Assets:  Communication Skills Desire for Improvement  ADL's:  Unable  to assess  Cognition:  WNL  Sleep:        Treatment Plan Summary: Case discussed with Dr. Jola Babinskilary. Medication management recommend consider Haldol 5 mg nightly p.o. or IM.  For increased agitation recommend consider PRN Haldol 5 mg p.o. or IM and  PRN Ativan 2 mg p.o. or IM.  Disposition: No evidence of imminent risk to self or others at present.   Patient does not meet criteria for psychiatric inpatient admission. Supportive therapy provided about ongoing stressors.  Patrcia Dollyina L Tate, FNP 08/14/2019 11:25 AM

## 2019-08-14 NOTE — Progress Notes (Signed)
2 Days Post-Op    CC: Foreign bodies in stomach  Subjective: Patient sedated, but wakes up easily when I told him we could take his NG tube out.  I have asked insurance to get him up and walking later today.  Objective: Vital signs in last 24 hours: Temp:  [97.6 F (36.4 C)-99.3 F (37.4 C)] 97.7 F (36.5 C) (03/17 0500) Pulse Rate:  [70-97] 97 (03/17 0500) Resp:  [15-20] 20 (03/17 0500) BP: (138-147)/(91-100) 142/91 (03/17 0500) SpO2:  [100 %] 100 % (03/17 0500) Last BM Date: 08/11/19 NPO 1770 IV 650 urine 450 NG Afebrile, vital signs are stable WBC still elevated 13.2 UGI: Shows no evidence of leak of water-soluble contrast from the stomach; exam was somewhat limited due to sedation.  Intake/Output from previous day: 03/16 0701 - 03/17 0700 In: 1770 [I.V.:769.8; IV Piggyback:1000.2] Out: 1100 [Urine:650; Emesis/NG output:450] Intake/Output this shift: No intake/output data recorded.  General appearance: alert, cooperative and no distress Resp: clear to auscultation bilaterally and He is acting like he is asleep so is not breathing very deep or moving GI: Soft, dressings intact.  Bowel sounds are hypoactive.  No BM so far  Lab Results:  Recent Labs    08/13/19 1440 08/14/19 0533  WBC 13.4* 12.5*  HGB 14.1 14.1  HCT 43.3 43.0  PLT 318 292    BMET Recent Labs    08/13/19 1440 08/14/19 0533  NA 140 136  K 3.9 3.9  CL 104 102  CO2 26 26  GLUCOSE 97 111*  BUN 10 11  CREATININE 0.64 0.83  CALCIUM 8.8* 8.9   PT/INR Recent Labs    08/12/19 0538  LABPROT 14.6  INR 1.2    Recent Labs  Lab 08/12/19 0538 08/13/19 1440 08/14/19 0533  AST 41 28 30  ALT 80* 61* 58*  ALKPHOS 63 59 53  BILITOT 0.7 1.0 0.8  PROT 8.0 7.5 7.2  ALBUMIN 3.6 3.5 3.3*     Lipase  No results found for: LIPASE   Medications: . Chlorhexidine Gluconate Cloth  6 each Topical Daily  . enoxaparin (LOVENOX) injection  40 mg Subcutaneous Q24H  . HYDROmorphone   Intravenous  Q4H  . pantoprazole (PROTONIX) IV  40 mg Intravenous Q12H    Assessment/Plan Incarcerated Hx schizophrenia  Ingestion of multiple foreign bodies Exploratory laparotomy with gastrotomy,gastrorrhaphy and removal of foreign bodies(ball point pen and 3 spoons) 08/12/2019, Dr. Maisie Fus [place] POD #3 UGI 3/18:  No leak>> pull NG and start clears  FEN: N.p.o./no IV fluids listed ID: Cipro/Flagyl 3/15 >> day 4 DVT: Lovenox Follow-up: Dr. Luisa Hart   Plan: DC NG and start him on clears.  I asked the sheriff's to get him up and walk him some more today.  We will advance his diet as tolerated.  We will switch him over to oral pain medicines and DC the PCA.  Continue Cipro/Flagyl and recheck labs in a.m.    LOS: 2 days    Harshita Bernales 08/14/2019 Please see Amion

## 2019-08-14 NOTE — Progress Notes (Signed)
2 Days Post-Op   Subjective/Chief Complaint: none  Cuffed in bed  Awake answers questions    Objective: Vital signs in last 24 hours: Temp:  [97.6 F (36.4 C)-99.3 F (37.4 C)] 97.7 F (36.5 C) (03/17 0500) Pulse Rate:  [70-97] 97 (03/17 0500) Resp:  [13-20] 13 (03/17 0731) BP: (138-147)/(91-100) 142/91 (03/17 0500) SpO2:  [98 %-100 %] 98 % (03/17 0731) Last BM Date: 08/11/19  Intake/Output from previous day: 03/16 0701 - 03/17 0700 In: 1770 [I.V.:769.8; IV Piggyback:1000.2] Out: 1100 [Urine:650; Emesis/NG output:450] Intake/Output this shift: No intake/output data recorded.  Incision/Wound:CDI dressing in place clean flat soft  Lab Results:  Recent Labs    08/13/19 1440 08/14/19 0533  WBC 13.4* 12.5*  HGB 14.1 14.1  HCT 43.3 43.0  PLT 318 292   BMET Recent Labs    08/13/19 1440 08/14/19 0533  NA 140 136  K 3.9 3.9  CL 104 102  CO2 26 26  GLUCOSE 97 111*  BUN 10 11  CREATININE 0.64 0.83  CALCIUM 8.8* 8.9   PT/INR Recent Labs    08/12/19 0538  LABPROT 14.6  INR 1.2   ABG No results for input(s): PHART, HCO3 in the last 72 hours.  Invalid input(s): PCO2, PO2  Studies/Results: No results found.  Anti-infectives: Anti-infectives (From admission, onward)   Start     Dose/Rate Route Frequency Ordered Stop   08/12/19 2300  ciprofloxacin (CIPRO) IVPB 400 mg     400 mg 200 mL/hr over 60 Minutes Intravenous Every 12 hours 08/12/19 1507 08/24/19 2259   08/12/19 1800  metroNIDAZOLE (FLAGYL) IVPB 500 mg     500 mg 100 mL/hr over 60 Minutes Intravenous Every 8 hours 08/12/19 1507 08/20/19 1759   08/12/19 1030  metroNIDAZOLE (FLAGYL) IVPB 500 mg     500 mg 100 mL/hr over 60 Minutes Intravenous On call to O.R. 08/12/19 1018 08/12/19 1133   08/12/19 1030  ciprofloxacin (CIPRO) IVPB 400 mg     400 mg 200 mL/hr over 60 Minutes Intravenous On call to O.R. 08/12/19 1018 08/12/19 1112      Assessment/Plan: s/p Procedure(s): EXPLORATORY LAPAROTOMY ,  GASTRorrhaphy, REMOVAL OF FOREIGN BODIES (N/A) UGI Thursday  Ambulate with assistance Discussed with nursing staff/ sheriff    LOS: 2 days    Clovis Pu Heliodoro Domagalski 08/14/2019

## 2019-08-14 NOTE — Progress Notes (Signed)
PROGRESS NOTE  Norman Vasquez WLN:[id number] DOB: May 17, 1996 DOA: 08/11/2019 PCP: Patient, No Pcp Per   HPI/Recap of past 24 hours Norman Vasquez is a 24 y.o. male with history of schizophrenia on Haldol and Cogentin intentionally swallowed a spoon and was brought to the ER from [place] jail. Patient states he is suicidal. Denies taking any drugs.  Patient admitted for further management.    Today, patient still complains of postop abdominal pain, denies any nausea/vomiting, fever/chills.  NG tube noted. Prison guards in room    Assessment/Plan: Principal Problem:   Foreign body in stomach Active Problems:   Suicidal ideations   Swallowed foreign body  Intentional ingestion of the foreign body CT abdomen pelvis showed at least 2 or possibly 3 radiolucent foreign bodies compatible with reported ingestion of plastic spoons, separate linear radiolucent foreign body with punctate radiodense tip likely a ballpoint pen in the gastric lumen no perforation Underwent EGD, unable to retrieve the foreign body General surgery on board, s/p ex laparotomy and retrieval of foreign bodies on 3/15, on NG suction, iv cipro and flagyl, dilaudid pca pump plan per general surgery Monitor closely  Schizophrenia  Currently denies suicidal/homicidal ideations Psychiatry consulted, recommend Haldol 5 mg nightly p.o. or IM, for increased agitation recommend as needed Haldol 5 mg and as needed Ativan 2 mg p.o. or IM.  Patient does not meet criteria for inpatient psych      DVT Prophylaxis: Lovenox  Code Status: Full  Family Communication: Discussed extensively with patient  Disposition Plan:    Patient came from:                     Prison                                                                                      Anticipated d/c place: TBD, as per general surgery  Barriers to d/c OR conditions which need to be met to effect a safe d/c:  Medically not ready to discharge,  remain on NG suction, need general surgery clearance   Consultants:  GI  General surgery  Psychiatry  Procedures:  EGD on March 15  Ex lap on March 15  Antibiotics:  IV Cipro and Flagyl   Objective: BP 132/87 (BP Location: Left Arm)   Pulse (!) 102   Temp 98.7 F (37.1 C) (Oral)   Resp 14   Ht 6' 2"  (1.88 m)   Wt 90.7 kg   SpO2 99%   BMI 25.67 kg/m   Intake/Output Summary (Last 24 hours) at 08/14/2019 1514 Last data filed at 08/14/2019 1403 Gross per 24 hour  Intake 1369.98 ml  Output 1900 ml  Net -530.02 ml   Filed Weights   08/12/19 0010 08/12/19 1015  Weight: 90.7 kg 90.7 kg    Exam:  General: NAD, on NG suction, handcuffs noted  Cardiovascular: S1, S2 present  Respiratory: CTAB  Abdomen: Soft, ++tender, nondistended, hypoactive bowel sounds present  Musculoskeletal: No bilateral pedal edema noted  Skin: Normal  Psychiatry: Normal mood   Data Reviewed: Basic Metabolic Panel: Recent Labs  Lab 08/11/19 2125 08/12/19 0538 08/13/19 1440 08/14/19 0533  NA 141 139 140 136  K 3.8 4.4 3.9 3.9  CL 110 106 104 102  CO2 23 25 26 26   GLUCOSE 89 121* 97 111*  BUN 11 13 10 11   CREATININE 0.73 0.90 0.64 0.83  CALCIUM 8.9 9.2 8.8* 8.9  MG  --   --   --  1.9   Liver Function Tests: Recent Labs  Lab 08/12/19 0538 08/13/19 1440 08/14/19 0533  AST 41 28 30  ALT 80* 61* 58*  ALKPHOS 63 59 53  BILITOT 0.7 1.0 0.8  PROT 8.0 7.5 7.2  ALBUMIN 3.6 3.5 3.3*   No results for input(s): LIPASE, AMYLASE in the last 168 hours. No results for input(s): AMMONIA in the last 168 hours. CBC: Recent Labs  Lab 08/11/19 2125 08/12/19 0538 08/13/19 1440 08/14/19 0533  WBC 12.5* 11.0* 13.4* 12.5*  NEUTROABS 8.5* 9.6*  --  8.8*  HGB 14.5 14.1 14.1 14.1  HCT 45.9 44.3 43.3 43.0  MCV 93.9 91.5 89.8 90.0  PLT 368 333 318 292   Cardiac Enzymes:   No results for input(s): CKTOTAL, CKMB, CKMBINDEX, TROPONINI in the last 168 hours. BNP (last 3  results) No results for input(s): BNP in the last 8760 hours.  ProBNP (last 3 results) No results for input(s): PROBNP in the last 8760 hours.  CBG: No results for input(s): GLUCAP in the last 168 hours.  Recent Results (from the past 240 hour(s))  Respiratory Panel by RT PCR (Flu A&B, Covid) - Nasopharyngeal Swab     Status: None   Collection Time: 08/11/19  9:25 PM   Specimen: Nasopharyngeal Swab  Result Value Ref Range Status   SARS Coronavirus 2 by RT PCR NEGATIVE NEGATIVE Final    Comment: (NOTE) SARS-CoV-2 target nucleic acids are NOT DETECTED. The SARS-CoV-2 RNA is generally detectable in upper respiratoy specimens during the acute phase of infection. The lowest concentration of SARS-CoV-2 viral copies this assay can detect is 131 copies/mL. A negative result does not preclude SARS-Cov-2 infection and should not be used as the sole basis for treatment or other patient management decisions. A negative result may occur with  improper specimen collection/handling, submission of specimen other than nasopharyngeal swab, presence of viral mutation(s) within the areas targeted by this assay, and inadequate number of viral copies (<131 copies/mL). A negative result must be combined with clinical observations, patient history, and epidemiological information. The expected result is Negative. Fact Sheet for Patients:  PinkCheek.be Fact Sheet for Healthcare Providers:  GravelBags.it This test is not yet ap proved or cleared by the [place] FDA and  has been authorized for detection and/or diagnosis of SARS-CoV-2 by FDA under an Emergency Use Authorization (EUA). This EUA will remain  in effect (meaning this test can be used) for the duration of the COVID-19 declaration under Section 564(b)(1) of the Act, 21 U.S.C. section 360bbb-3(b)(1), unless the authorization is terminated or revoked sooner.    Influenza A by PCR  NEGATIVE NEGATIVE Final   Influenza B by PCR NEGATIVE NEGATIVE Final    Comment: (NOTE) The Xpert Xpress SARS-CoV-2/FLU/RSV assay is intended as an aid in  the diagnosis of influenza from Nasopharyngeal swab specimens and  should not be used as a sole basis for treatment. Nasal washings and  aspirates are unacceptable for Xpert Xpress SARS-CoV-2/FLU/RSV  testing. Fact Sheet for Patients: PinkCheek.be Fact Sheet for Healthcare Providers: GravelBags.it This test is not yet approved or cleared by the [place] and  has been authorized for  detection and/or diagnosis of SARS-CoV-2 by  FDA under an Emergency Use Authorization (EUA). This EUA will remain  in effect (meaning this test can be used) for the duration of the  Covid-19 declaration under Section 564(b)(1) of the Act, 21  U.S.C. section 360bbb-3(b)(1), unless the authorization is  terminated or revoked. Performed at [facility], [place] [address], [place], [place] [address]      Studies: No results found.  Scheduled Meds: . Chlorhexidine Gluconate Cloth  6 each Topical Daily  . enoxaparin (LOVENOX) injection  40 mg Subcutaneous Q24H  . HYDROmorphone   Intravenous Q4H  . pantoprazole (PROTONIX) IV  40 mg Intravenous Q12H    Continuous Infusions: . ciprofloxacin 400 mg (08/14/19 1203)  . dextrose 5% lactated ringers with KCl 20 mEq/L Stopped (08/14/19 1417)  . methocarbamol (ROBAXIN) IV 1,000 mg (08/14/19 1417)  . metronidazole 500 mg (08/14/19 1044)     Alma Friendly MD  [facility]    08/14/2019, 3:14 PM  LOS: 2 days

## 2019-08-14 NOTE — Progress Notes (Signed)
Patient PCA pump keeps going off due to his respiration undetected patient was instructed to take a deep a breath and got upset about constantly reminding him to take a deep breath verbalizes " stop waking me up, you all woke me up 30x now " Patient was educated about how to stop the pump from going off and that we can't keep it alarming the whole time, patient suddenly just ripped off his finger proved that measure his  oxygen level and and heart rate.

## 2019-08-14 NOTE — Progress Notes (Signed)
  Yellow MEWS  DR. Ezenduka paged by [employer]. Waiting for response.  Patient is stable at this time.  Charge nurse Christena Deem, RN notified.   MEWS/VS Documentation      08/14/2019 1402 08/14/2019 1535 08/14/2019 1819 08/14/2019 1833   MEWS Score:  1  1  3  2    MEWS Score Color:  Green  Green  Yellow  Yellow   Resp:  14  18  10  16    Pulse:  (!) 102  --  (!) 112  (!) 117   BP:  132/87  --  113/71  124/88   Temp:  98.7 F (37.1 C)  --  99 F (37.2 C)  99.1 F (37.3 C)   O2 Device:  Nasal Cannula  Nasal Cannula  Nasal Cannula  Nasal Cannula   O2 Flow Rate (L/min):  --  2 L/min  2 L/min  2 L/min           Norman Vasquez 08/14/2019,6:40 PM

## 2019-08-15 ENCOUNTER — Inpatient Hospital Stay (HOSPITAL_COMMUNITY)

## 2019-08-15 LAB — CBC WITH DIFFERENTIAL/PLATELET
Abs Immature Granulocytes: 0.06 10*3/uL (ref 0.00–0.07)
Basophils Absolute: 0.1 10*3/uL (ref 0.0–0.1)
Basophils Relative: 1 %
Eosinophils Absolute: 0.2 10*3/uL (ref 0.0–0.5)
Eosinophils Relative: 1 %
HCT: 47.7 % (ref 39.0–52.0)
Hemoglobin: 15.7 g/dL (ref 13.0–17.0)
Immature Granulocytes: 1 %
Lymphocytes Relative: 16 %
Lymphs Abs: 2.1 10*3/uL (ref 0.7–4.0)
MCH: 29.9 pg (ref 26.0–34.0)
MCHC: 32.9 g/dL (ref 30.0–36.0)
MCV: 90.9 fL (ref 80.0–100.0)
Monocytes Absolute: 1.1 10*3/uL — ABNORMAL HIGH (ref 0.1–1.0)
Monocytes Relative: 8 %
Neutro Abs: 9.7 10*3/uL — ABNORMAL HIGH (ref 1.7–7.7)
Neutrophils Relative %: 73 %
Platelets: 316 10*3/uL (ref 150–400)
RBC: 5.25 MIL/uL (ref 4.22–5.81)
RDW: 13 % (ref 11.5–15.5)
WBC: 13.2 10*3/uL — ABNORMAL HIGH (ref 4.0–10.5)
nRBC: 0 % (ref 0.0–0.2)

## 2019-08-15 LAB — BASIC METABOLIC PANEL
Anion gap: 10 (ref 5–15)
BUN: 14 mg/dL (ref 6–20)
CO2: 24 mmol/L (ref 22–32)
Calcium: 9.1 mg/dL (ref 8.9–10.3)
Chloride: 103 mmol/L (ref 98–111)
Creatinine, Ser: 0.7 mg/dL (ref 0.61–1.24)
GFR calc Af Amer: >60 mL/min (ref >60)
GFR calc non Af Amer: >60 mL/min (ref >60)
Glucose, Bld: 140 mg/dL — ABNORMAL HIGH (ref 70–99)
Potassium: 3.6 mmol/L (ref 3.5–5.1)
Sodium: 137 mmol/L (ref 135–145)

## 2019-08-15 MED ORDER — SODIUM CHLORIDE 0.9 % IV SOLN: INTRAVENOUS | Status: AC

## 2019-08-15 MED ORDER — OXYCODONE HCL 5 MG PO TABS
5.0000 mg | ORAL_TABLET | ORAL | Status: DC | PRN
  Administered 2019-08-15 – 2019-08-17 (×7): 10 mg via ORAL
  Filled 2019-08-15 (×7): qty 2

## 2019-08-15 MED ORDER — IOHEXOL 300 MG/ML  SOLN
100.0000 mL | Freq: Once | INTRAMUSCULAR | Status: AC | PRN
  Administered 2019-08-15: 100 mL

## 2019-08-15 MED ORDER — HYDROMORPHONE HCL 1 MG/ML IJ SOLN
0.5000 mg | INTRAMUSCULAR | Status: DC | PRN
  Administered 2019-08-16 – 2019-08-17 (×6): 1 mg via INTRAVENOUS
  Filled 2019-08-15 (×6): qty 1

## 2019-08-15 MED ORDER — METHOCARBAMOL 500 MG PO TABS
750.0000 mg | ORAL_TABLET | Freq: Four times a day (QID) | ORAL | Status: DC
  Administered 2019-08-15 – 2019-08-16 (×7): 750 mg via ORAL
  Filled 2019-08-15 (×7): qty 2

## 2019-08-15 MED ORDER — PANTOPRAZOLE SODIUM 40 MG PO TBEC
40.0000 mg | DELAYED_RELEASE_TABLET | Freq: Two times a day (BID) | ORAL | Status: DC
  Administered 2019-08-15 – 2019-08-18 (×8): 40 mg via ORAL
  Filled 2019-08-15 (×7): qty 1

## 2019-08-15 NOTE — Progress Notes (Addendum)
PROGRESS NOTE  Norman Vasquez NBV:[id number] DOB: 1996/04/09 DOA: 08/11/2019 PCP: Patient, No Pcp Per   HPI/Recap of past 24 hours Norman Vasquez is a 24 y.o. male with history of schizophrenia on Haldol and Cogentin intentionally swallowed a spoon and was brought to the ER from [place] jail. Patient states he is suicidal. Denies taking any drugs.  Patient admitted for further management.    Today, patient still complains of postop abdominal pain, denies any new complaints.    Assessment/Plan: Principal Problem:   Foreign body in stomach Active Problems:   Suicidal ideations   Swallowed foreign body  Intentional ingestion of the foreign body s/p ex lap on 08/12/19 CT abdomen pelvis showed at least 2 or possibly 3 radiolucent foreign bodies compatible with reported ingestion of plastic spoons, separate linear radiolucent foreign body with punctate radiodense tip likely a ballpoint pen in the gastric lumen no perforation Underwent EGD, unable to retrieve the foreign body General surgery on board, s/p ex laparotomy and retrieval of foreign bodies on 3/15, on NG suction, iv cipro and flagyl, dilaudid pca pump plan per general surgery Monitor closely  Leukocytosis Likely reactive post op Afebrile Daily cbc  Schizophrenia  Currently denies suicidal/homicidal ideations Psychiatry consulted, recommend Haldol 5 mg nightly p.o. or IM, for increased agitation recommend as needed Haldol 5 mg and as needed Ativan 2 mg p.o. or IM.  Patient does not meet criteria for inpatient psych as he is from prison      DVT Prophylaxis: Lovenox  Code Status: Full  Family Communication: Discussed extensively with patient  Disposition Plan:    Patient came from:                     Prison                                                                                      Anticipated d/c place: TBD, as per general surgery  Barriers to d/c OR conditions which need to be met to effect  a safe d/c:  Medically not ready to discharge, remain on NG suction, need general surgery sign off   Consultants:  GI  General surgery  Psychiatry  Procedures:  EGD on March 15  Ex lap on March 15  Antibiotics:  IV Cipro and Flagyl   Objective: BP (!) 127/94   Pulse (!) 106   Temp 98.4 F (36.9 C) (Oral)   Resp 15   Ht 6' 2"  (1.88 m)   Wt 90.7 kg   SpO2 93%   BMI 25.67 kg/m   Intake/Output Summary (Last 24 hours) at 08/15/2019 1345 Last data filed at 08/15/2019 1236 Gross per 24 hour  Intake 1110 ml  Output 1704 ml  Net -594 ml   Filed Weights   08/12/19 0010 08/12/19 1015  Weight: 90.7 kg 90.7 kg    Exam:  General: NAD, on NG suction, handcuffs noted  Cardiovascular: S1, S2 present  Respiratory: CTAB  Abdomen: Soft, ++tender, nondistended, bowel sounds present, incision C/D/I  Musculoskeletal: No bilateral pedal edema noted  Skin: Normal  Psychiatry: Normal mood    Data Reviewed: Basic Metabolic Panel: Recent  Labs  Lab 08/11/19 2125 08/12/19 0538 08/13/19 1440 08/14/19 0533 08/15/19 0557  NA 141 139 140 136 137  K 3.8 4.4 3.9 3.9 3.6  CL 110 106 104 102 103  CO2 23 25 26 26 24   GLUCOSE 89 121* 97 111* 140*  BUN 11 13 10 11 14   CREATININE 0.73 0.90 0.64 0.83 0.70  CALCIUM 8.9 9.2 8.8* 8.9 9.1  MG  --   --   --  1.9  --    Liver Function Tests: Recent Labs  Lab 08/12/19 0538 08/13/19 1440 08/14/19 0533  AST 41 28 30  ALT 80* 61* 58*  ALKPHOS 63 59 53  BILITOT 0.7 1.0 0.8  PROT 8.0 7.5 7.2  ALBUMIN 3.6 3.5 3.3*   No results for input(s): LIPASE, AMYLASE in the last 168 hours. No results for input(s): AMMONIA in the last 168 hours. CBC: Recent Labs  Lab 08/11/19 2125 08/12/19 0538 08/13/19 1440 08/14/19 0533 08/15/19 0557  WBC 12.5* 11.0* 13.4* 12.5* 13.2*  NEUTROABS 8.5* 9.6*  --  8.8* 9.7*  HGB 14.5 14.1 14.1 14.1 15.7  HCT 45.9 44.3 43.3 43.0 47.7  MCV 93.9 91.5 89.8 90.0 90.9  PLT 368 333 318 292 316    Cardiac Enzymes:   No results for input(s): CKTOTAL, CKMB, CKMBINDEX, TROPONINI in the last 168 hours. BNP (last 3 results) No results for input(s): BNP in the last 8760 hours.  ProBNP (last 3 results) No results for input(s): PROBNP in the last 8760 hours.  CBG: No results for input(s): GLUCAP in the last 168 hours.  Recent Results (from the past 240 hour(s))  Respiratory Panel by RT PCR (Flu A&B, Covid) - Nasopharyngeal Swab     Status: None   Collection Time: 08/11/19  9:25 PM   Specimen: Nasopharyngeal Swab  Result Value Ref Range Status   SARS Coronavirus 2 by RT PCR NEGATIVE NEGATIVE Final    Comment: (NOTE) SARS-CoV-2 target nucleic acids are NOT DETECTED. The SARS-CoV-2 RNA is generally detectable in upper respiratoy specimens during the acute phase of infection. The lowest concentration of SARS-CoV-2 viral copies this assay can detect is 131 copies/mL. A negative result does not preclude SARS-Cov-2 infection and should not be used as the sole basis for treatment or other patient management decisions. A negative result may occur with  improper specimen collection/handling, submission of specimen other than nasopharyngeal swab, presence of viral mutation(s) within the areas targeted by this assay, and inadequate number of viral copies (<131 copies/mL). A negative result must be combined with clinical observations, patient history, and epidemiological information. The expected result is Negative. Fact Sheet for Patients:  PinkCheek.be Fact Sheet for Healthcare Providers:  GravelBags.it This test is not yet ap proved or cleared by the [place] FDA and  has been authorized for detection and/or diagnosis of SARS-CoV-2 by FDA under an Emergency Use Authorization (EUA). This EUA will remain  in effect (meaning this test can be used) for the duration of the COVID-19 declaration under Section 564(b)(1) of the Act,  21 U.S.C. section 360bbb-3(b)(1), unless the authorization is terminated or revoked sooner.    Influenza A by PCR NEGATIVE NEGATIVE Final   Influenza B by PCR NEGATIVE NEGATIVE Final    Comment: (NOTE) The Xpert Xpress SARS-CoV-2/FLU/RSV assay is intended as an aid in  the diagnosis of influenza from Nasopharyngeal swab specimens and  should not be used as a sole basis for treatment. Nasal washings and  aspirates are unacceptable for Xpert Xpress  SARS-CoV-2/FLU/RSV  testing. Fact Sheet for Patients: PinkCheek.be Fact Sheet for Healthcare Providers: GravelBags.it This test is not yet approved or cleared by the [place] FDA and  has been authorized for detection and/or diagnosis of SARS-CoV-2 by  FDA under an Emergency Use Authorization (EUA). This EUA will remain  in effect (meaning this test can be used) for the duration of the  Covid-19 declaration under Section 564(b)(1) of the Act, 21  U.S.C. section 360bbb-3(b)(1), unless the authorization is  terminated or revoked. Performed at [facility], [place] [address], [place], [place] [address]      Studies: DG UGI W SINGLE CM (SOL OR THIN BA)  Result Date: 08/15/2019 CLINICAL DATA:  Postop day 2 for exploratory laparotomy with surgical removal of foreign bodies from the stomach. EXAM: UPPER GI SERIES WITH KUB TECHNIQUE: After obtaining a scout radiograph a routine upper GI series was performed using water-soluble contrast (Omnipaque) FLUOROSCOPY TIME:  Fluoroscopy Time:  2 minutes 12 seconds Radiation Exposure Index (if provided by the fluoroscopic device): 43.7 mGy Number of Acquired Spot Images: 8 COMPARISON:  CT 08/12/2019 FINDINGS: Patient was too weak to stand and therefore was examined in semi upright position (approximately 45 degrees). Patient did have limited mobility due to sedation. Contrast was injected through the NG tube. Sixty cc of contrast  injected into the stomach. Contrast filled the stomach without evidence of extraluminal leak. Patient was placed in RIGHT-side-down position and contrast emptied into the duodenum. Subsequently contrast travels into the small bowel. No evidence of leak from the stomach. IMPRESSION: No evidence of leak of water-soluble contrast from the stomach on somewhat limited exam. Contrast travels to the ligament Treitz and and beyond. These results will be called to the ordering clinician or representative by the Radiologist Assistant, and communication documented in the PACS or [employer]. Electronically Signed   By: Suzy Bouchard M.D.   On: 08/15/2019 09:37    Scheduled Meds: . Chlorhexidine Gluconate Cloth  6 each Topical Daily  . enoxaparin (LOVENOX) injection  40 mg Subcutaneous Q24H  . haloperidol  5 mg Oral QHS  . methocarbamol  750 mg Oral QID  . pantoprazole  40 mg Oral BID    Continuous Infusions: . ciprofloxacin 400 mg (08/15/19 1156)  . metronidazole 500 mg (08/15/19 1037)     Alma Friendly MD  [facility]    08/15/2019, 1:45 PM  LOS: 3 days

## 2019-08-16 LAB — BASIC METABOLIC PANEL
Anion gap: 7 (ref 5–15)
BUN: 14 mg/dL (ref 6–20)
CO2: 26 mmol/L (ref 22–32)
Calcium: 9.2 mg/dL (ref 8.9–10.3)
Chloride: 102 mmol/L (ref 98–111)
Creatinine, Ser: 0.8 mg/dL (ref 0.61–1.24)
GFR calc Af Amer: >60 mL/min (ref >60)
GFR calc non Af Amer: >60 mL/min (ref >60)
Glucose, Bld: 99 mg/dL (ref 70–99)
Potassium: 3.9 mmol/L (ref 3.5–5.1)
Sodium: 135 mmol/L (ref 135–145)

## 2019-08-16 LAB — CBC WITH DIFFERENTIAL/PLATELET
Abs Immature Granulocytes: 0.09 10*3/uL — ABNORMAL HIGH (ref 0.00–0.07)
Basophils Absolute: 0.1 10*3/uL (ref 0.0–0.1)
Basophils Relative: 1 %
Eosinophils Absolute: 0.8 10*3/uL — ABNORMAL HIGH (ref 0.0–0.5)
Eosinophils Relative: 8 %
HCT: 44.9 % (ref 39.0–52.0)
Hemoglobin: 14.8 g/dL (ref 13.0–17.0)
Immature Granulocytes: 1 %
Lymphocytes Relative: 24 %
Lymphs Abs: 2.4 10*3/uL (ref 0.7–4.0)
MCH: 29.2 pg (ref 26.0–34.0)
MCHC: 33 g/dL (ref 30.0–36.0)
MCV: 88.6 fL (ref 80.0–100.0)
Monocytes Absolute: 0.8 10*3/uL (ref 0.1–1.0)
Monocytes Relative: 8 %
Neutro Abs: 5.7 10*3/uL (ref 1.7–7.7)
Neutrophils Relative %: 58 %
Platelets: 333 10*3/uL (ref 150–400)
RBC: 5.07 MIL/uL (ref 4.22–5.81)
RDW: 13.1 % (ref 11.5–15.5)
WBC: 9.7 10*3/uL (ref 4.0–10.5)
nRBC: 0 % (ref 0.0–0.2)

## 2019-08-16 NOTE — Progress Notes (Signed)
Patient was up and moving around so heart rate was elevated.  Tech rechecked vitals and they had gone back to normal.  Unfortunately these vitals were not documented.

## 2019-08-16 NOTE — Progress Notes (Signed)
4 Days Post-Op   Subjective/Chief Complaint: none No complaints  Normal UGI   Objective: Vital signs in last 24 hours: Temp:  [98.4 F (36.9 C)-99 F (37.2 C)] 98.4 F (36.9 C) (03/19 0752) Pulse Rate:  [83-106] 89 (03/19 0752) Resp:  [15-17] 16 (03/19 0752) BP: (105-127)/(64-94) 105/64 (03/19 0752) SpO2:  [93 %-99 %] 99 % (03/19 0752) Last BM Date: 08/11/19  Intake/Output from previous day: 03/18 0701 - 03/19 0700 In: 1900 [P.O.:1800; IV Piggyback:100] Out: -  Intake/Output this shift: No intake/output data recorded.  Incision/Wound:CDI  INCISION DRESSING IN PLACE   Lab Results:  Recent Labs    08/14/19 0533 08/15/19 0557  WBC 12.5* 13.2*  HGB 14.1 15.7  HCT 43.0 47.7  PLT 292 316   BMET Recent Labs    08/14/19 0533 08/15/19 0557  NA 136 137  K 3.9 3.6  CL 102 103  CO2 26 24  GLUCOSE 111* 140*  BUN 11 14  CREATININE 0.83 0.70  CALCIUM 8.9 9.1   PT/INR No results for input(s): LABPROT, INR in the last 72 hours. ABG No results for input(s): PHART, HCO3 in the last 72 hours.  Invalid input(s): PCO2, PO2  Studies/Results: DG UGI W SINGLE CM (SOL OR THIN BA)  Result Date: 08/15/2019 CLINICAL DATA:  Postop day 2 for exploratory laparotomy with surgical removal of foreign bodies from the stomach. EXAM: UPPER GI SERIES WITH KUB TECHNIQUE: After obtaining a scout radiograph a routine upper GI series was performed using water-soluble contrast (Omnipaque) FLUOROSCOPY TIME:  Fluoroscopy Time:  2 minutes 12 seconds Radiation Exposure Index (if provided by the fluoroscopic device): 43.7 mGy Number of Acquired Spot Images: 8 COMPARISON:  CT 08/12/2019 FINDINGS: Patient was too weak to stand and therefore was examined in semi upright position (approximately 45 degrees). Patient did have limited mobility due to sedation. Contrast was injected through the NG tube. Sixty cc of contrast injected into the stomach. Contrast filled the stomach without evidence of extraluminal  leak. Patient was placed in RIGHT-side-down position and contrast emptied into the duodenum. Subsequently contrast travels into the small bowel. No evidence of leak from the stomach. IMPRESSION: No evidence of leak of water-soluble contrast from the stomach on somewhat limited exam. Contrast travels to the ligament Treitz and and beyond. These results will be called to the ordering clinician or representative by the Radiologist Assistant, and communication documented in the PACS or [employer]. Electronically Signed   By: Genevive Bi M.D.   On: 08/15/2019 09:37    Anti-infectives: Anti-infectives (From admission, onward)   Start     Dose/Rate Route Frequency Ordered Stop   08/12/19 2300  ciprofloxacin (CIPRO) IVPB 400 mg     400 mg 200 mL/hr over 60 Minutes Intravenous Every 12 hours 08/12/19 1507 08/24/19 2259   08/12/19 1800  metroNIDAZOLE (FLAGYL) IVPB 500 mg     500 mg 100 mL/hr over 60 Minutes Intravenous Every 8 hours 08/12/19 1507 08/20/19 1759   08/12/19 1030  metroNIDAZOLE (FLAGYL) IVPB 500 mg     500 mg 100 mL/hr over 60 Minutes Intravenous On call to O.R. 08/12/19 1018 08/12/19 1133   08/12/19 1030  ciprofloxacin (CIPRO) IVPB 400 mg     400 mg 200 mL/hr over 60 Minutes Intravenous On call to O.R. 08/12/19 1018 08/12/19 1112      Assessment/Plan: s/p Procedure(s): EXPLORATORY LAPAROTOMY , GASTRorrhaphy, REMOVAL OF FOREIGN BODIES (N/A) ADVANCE DIET  CAN BE DISCHARGED  OVER THE WEEKEND ONCE TOLERATING REGULAR DIET  LOS: 4 days    Joyice Faster Martine Bleecker 08/16/2019

## 2019-08-16 NOTE — Discharge Instructions (Signed)
Swallowed Foreign Body, Adult  A swallowed foreign body means that you swallowed something and it got stuck. It might be food or something else. The object may get stuck in the part of your body that moves food from your mouth to your stomach (esophagus), or it may get stuck in another part of your belly (digestive tract). Often, the object will pass through your body on its own. The object may need to be taken out by a doctor if it is dangerous or if it will not pass through your body on its own. Objects may be swallowed by accident or on purpose. It is very important to tell your doctor what you swallowed. Some swallowed objects can be very dangerous. You may need emergency treatment if:  An object gets stuck in your throat.  You cannot swallow.  You cannot breathe well.  The object is sharp.  The object is harmful or poisonous (toxic). What are the causes? The most common cause is food that will not pass through the part of your body that moves food from your mouth to your stomach. When this happens, it is called food impaction. Foods that may cause this include:  Meat.  Hard vegetables, such as carrots and radishes. Other common swallowed objects include:  Pieces of bone from meat or fish.  Toothpicks.  Dentures. What increases the risk?  Wearing dentures.  Having been drinking alcohol or taking drugs.  Having a mental health condition.  Having trouble with thinking and learning (cognitive impairment).  Having a narrowed or scarred area in your belly. What are the signs or symptoms?  Pain or pressure in your throat or chest.  Not being able to swallow food or liquid.  Not being able to swallow your spit (saliva).  Drooling.  Choking.  A hoarse voice.  Trouble breathing.  Noisy breathing.  Vomit that has blood in it. How is this treated? No treatment is needed if the object is not dangerous and will come out (pass) in your poop (stool). If the swallowed  object is not dangerous, but it is stuck in the part of your body that moves food from your mouth to your stomach:  Your doctor may gently suction out the object through your mouth.  You may be given a medicine to relax the muscles and allow the object to pass through to the stomach.  A procedure called endoscopy may be done to find and remove the object if it does not come out with medicine. Your doctor will put medical tools through a tube (endoscope) to remove the object. You may need emergency treatment if:  The object is causing you to breathe in (inhale) spit into your lungs (aspirate).  The object is pressing on your airway. This makes it hard for you to breathe.  The object can harm the inside of your belly. Some objects that can cause harm include batteries, magnets, sharp objects, and drugs. Follow these instructions at home: Caring for yourself  If the object in your belly is expected to come out in your poop: ? Eat what you normally eat if your doctor says that you can. ? Keep checking your poop to see if the object has come out of your body. ? Call your doctor if the object has not come out of your body after 3 days.  If you had a procedure to remove the object, follow instructions from your doctor about caring for yourself after the procedure. General instructions  Take over-the-counter and  prescription medicines only as told by your doctor.  Keep all follow-up visits as told by your doctor. This is important. How is this prevented?  Cut your food into small pieces.  Always sit upright while eating.  Remove bones from meat and fish.  Chew your food well before swallowing.  Do not talk, laugh, or walk around while eating or swallowing. Contact a doctor if:  You still have problems after you have been treated.  The object has not come out of your body after 3 days. Get help right away if:  You have a fever.  You have pain in your chest or your belly.  You  cough up blood.  You have blood in your poop.  You have blood in your vomit after treatment. Summary  A swallowed foreign body means that you swallowed something and it got stuck.  The object may get stuck in the part of the body that moves food from your mouth to your stomach (esophagus), or it may get stuck in another part of your belly (digestive tract).  Often, the object will pass through your body on its own.  An endoscopy may be done to find and remove the object if it does not pass out of your body after you take medicine.  Call your doctor if the object has not come out of your body after 3 days, you have blood in your poop, or you have blood when you cough or vomit. This information is not intended to replace advice given to you by your health care provider. Make sure you discuss any questions you have with your health care provider. Document Revised: 03/29/2018 Document Reviewed: 03/29/2018 Elsevier Patient Education  2020 [employer].  Post Anesthesia Home Care Instructions  Activity: Get plenty of rest for the remainder of the day. A responsible individual must stay with you for 24 hours following the procedure.  For the next 24 hours, DO NOT: -Drive a car -Advertising copywriter -Drink alcoholic beverages -Take any medication unless instructed by your physician -Make any legal decisions or sign important papers.  Meals: Start with liquid foods such as gelatin or soup. Progress to regular foods as tolerated. Avoid greasy, spicy, heavy foods. If nausea and/or vomiting occur, drink only clear liquids until the nausea and/or vomiting subsides. Call your physician if vomiting continues.  Special Instructions/Symptoms: Your throat may feel dry or sore from the anesthesia or the breathing tube placed in your throat during surgery. If this causes discomfort, gargle with warm salt water. The discomfort should disappear within 24 hours.  If you had a scopolamine patch placed  behind your ear for the management of post- operative nausea and/or vomiting:  1. The medication in the patch is effective for 72 hours, after which it should be removed.  Wrap patch in a tissue and discard in the trash. Wash hands thoroughly with soap and water. 2. You may remove the patch earlier than 72 hours if you experience unpleasant side effects which may include dry mouth, dizziness or visual disturbances. 3. Avoid touching the patch. Wash your hands with soap and water after contact with the patch.    [facility]      [facility], [place] [phone]  OPEN ABDOMINAL SURGERY: POST OP INSTRUCTIONS  Always review your discharge instruction sheet given to you by the facility where your surgery was performed.  IF YOU HAVE DISABILITY OR FAMILY LEAVE FORMS, YOU MUST BRING THEM TO THE OFFICE FOR PROCESSING.  PLEASE DO NOT GIVE  THEM TO YOUR DOCTOR.  1. A prescription for pain medication may be given to you upon discharge.  Take your pain medication as prescribed, if needed.  If narcotic pain medicine is not needed, then you may take acetaminophen (Tylenol) or ibuprofen (Advil) as needed. 2. Take your usually prescribed medications unless otherwise directed. 3. If you need a refill on your pain medication, please contact your pharmacy. They will contact our office to request authorization.  Prescriptions will not be filled after 5pm or on week-ends. 4. You should follow a light diet the first few days after arrival home, such as soup and crackers, pudding, etc.unless your doctor has advised otherwise. A high-fiber, low fat diet can be resumed as tolerated.   Be sure to include lots of fluids daily. Most patients will experience some swelling and bruising on the chest and neck area.  Ice packs will help.  Swelling and bruising can take several days to resolve 5. Most patients will experience some swelling and bruising in the area of the incision. Ice pack will help. Swelling and bruising can take  several days to resolve..  6. It is common to experience some constipation if taking pain medication after surgery.  Increasing fluid intake and taking a stool softener will usually help or prevent this problem from occurring.  A mild laxative (Milk of Magnesia or Miralax) should be taken according to package directions if there are no bowel movements after 48 hours. 7.  You may have steri-strips (small skin tapes) in place directly over the incision.  These strips should be left on the skin for 7-10 days.  If your surgeon used skin glue on the incision, you may shower in 24 hours.  The glue will flake off over the next 2-3 weeks.  Any sutures or staples will be removed at the office during your follow-up visit. You may find that a light gauze bandage over your incision may keep your staples from being rubbed or pulled. You may shower and replace the bandage daily. 8. ACTIVITIES:  You may resume regular (light) daily activities beginning the next day--such as daily self-care, walking, climbing stairs--gradually increasing activities as tolerated.  You may have sexual intercourse when it is comfortable.  Refrain from any heavy lifting or straining until approved by your doctor. a. You may drive when you no longer are taking prescription pain medication, you can comfortably wear a seatbelt, and you can safely maneuver your car and apply brakes b. Return to Work: ___________________________________ 9. You should see your doctor in the office for a follow-up appointment approximately two weeks after your surgery.  Make sure that you call for this appointment within a day or two after you arrive home to insure a convenient appointment time. OTHER INSTRUCTIONS:  _____________________________________________________________ _____________________________________________________________  WHEN TO CALL YOUR DOCTOR: 1. Fever over 101.0 2. Inability to urinate 3. Nausea and/or vomiting 4. Extreme swelling or  bruising 5. Continued bleeding from incision. 6. Increased pain, redness, or drainage from the incision. 7. Difficulty swallowing or breathing 8. Muscle cramping or spasms. 9. Numbness or tingling in hands or feet or around lips.  The clinic staff is available to answer your questions during regular business hours.  Please don't hesitate to call and ask to speak to one of the nurses if you have concerns.  For further questions, please visit [email or web address]

## 2019-08-16 NOTE — Progress Notes (Signed)
PROGRESS NOTE  Norman Vasquez ZSW:[id number] DOB: 05-Jan-1996 DOA: 08/11/2019 PCP: Patient, No Pcp Per   HPI/Recap of past 24 hours Norman Vasquez is a 24 y.o. male with history of schizophrenia on Haldol and Cogentin intentionally swallowed a spoon and was brought to the ER from [place] jail. Patient states he is suicidal. Denies taking any drugs.  Patient admitted for further management.    Today, patient denies any new complaints.  Was able to tolerate his breakfast this morning.    Assessment/Plan: Principal Problem:   Foreign body in stomach Active Problems:   Suicidal ideations   Swallowed foreign body  Intentional ingestion of the foreign body s/p ex lap on 08/12/19 CT abdomen pelvis showed at least 2 or possibly 3 radiolucent foreign bodies compatible with reported ingestion of plastic spoons, separate linear radiolucent foreign body with punctate radiodense tip likely a ballpoint pen in the gastric lumen no perforation Underwent EGD, unable to retrieve the foreign body General surgery on board, s/p ex laparotomy and retrieval of foreign bodies on 3/15, D/C NG suction, advanced diet per surgery Continue iv cipro and flagyl, pain management per general surgery Monitor closely  Leukocytosis Resolved Likely reactive post op Afebrile Daily cbc  Schizophrenia  Currently denies suicidal/homicidal ideations Psychiatry consulted, recommend Haldol 5 mg nightly p.o. or IM, for increased agitation recommend as needed Haldol 5 mg and as needed Ativan 2 mg p.o. or IM.  Patient does not meet criteria for inpatient psych as he is from prison      DVT Prophylaxis: Lovenox  Code Status: Full  Family Communication: Discussed extensively with patient  Disposition Plan:    Patient came from:                     Prison                                                                                      Anticipated d/c place: TBD, as per general surgery, likely over  this weekend  Barriers to d/c OR conditions which need to be met to effect a safe d/c:  Need general surgery sign off   Consultants:  GI  General surgery  Psychiatry  Procedures:  EGD on March 15  Ex lap on March 15  Antibiotics:  IV Cipro and Flagyl   Objective: BP 114/68 (BP Location: Left Arm)   Pulse (!) 105   Temp 97.6 F (36.4 C) (Oral)   Resp 16   Ht 6' 2"  (1.88 m)   Wt 90.7 kg   SpO2 98%   BMI 25.67 kg/m   Intake/Output Summary (Last 24 hours) at 08/16/2019 1429 Last data filed at 08/16/2019 1320 Gross per 24 hour  Intake 1920 ml  Output --  Net 1920 ml   Filed Weights   08/12/19 0010 08/12/19 1015  Weight: 90.7 kg 90.7 kg    Exam:  General: NAD, handcuffs/leg chains noted  Cardiovascular: S1, S2 present  Respiratory: CTAB  Abdomen: Soft, non-tender, nondistended, bowel sounds present, incision C/D/I  Musculoskeletal: No bilateral pedal edema noted  Skin: Normal  Psychiatry: Normal mood    Data Reviewed: Basic Metabolic  Panel: Recent Labs  Lab 08/12/19 0538 08/13/19 1440 08/14/19 0533 08/15/19 0557 08/16/19 0847  NA 139 140 136 137 135  K 4.4 3.9 3.9 3.6 3.9  CL 106 104 102 103 102  CO2 25 26 26 24 26   GLUCOSE 121* 97 111* 140* 99  BUN 13 10 11 14 14   CREATININE 0.90 0.64 0.83 0.70 0.80  CALCIUM 9.2 8.8* 8.9 9.1 9.2  MG  --   --  1.9  --   --    Liver Function Tests: Recent Labs  Lab 08/12/19 0538 08/13/19 1440 08/14/19 0533  AST 41 28 30  ALT 80* 61* 58*  ALKPHOS 63 59 53  BILITOT 0.7 1.0 0.8  PROT 8.0 7.5 7.2  ALBUMIN 3.6 3.5 3.3*   No results for input(s): LIPASE, AMYLASE in the last 168 hours. No results for input(s): AMMONIA in the last 168 hours. CBC: Recent Labs  Lab 08/11/19 2125 08/11/19 2125 08/12/19 0538 08/13/19 1440 08/14/19 0533 08/15/19 0557 08/16/19 0847  WBC 12.5*   < > 11.0* 13.4* 12.5* 13.2* 9.7  NEUTROABS 8.5*  --  9.6*  --  8.8* 9.7* 5.7  HGB 14.5   < > 14.1 14.1 14.1 15.7 14.8   HCT 45.9   < > 44.3 43.3 43.0 47.7 44.9  MCV 93.9   < > 91.5 89.8 90.0 90.9 88.6  PLT 368   < > 333 318 292 316 333   < > = values in this interval not displayed.   Cardiac Enzymes:   No results for input(s): CKTOTAL, CKMB, CKMBINDEX, TROPONINI in the last 168 hours. BNP (last 3 results) No results for input(s): BNP in the last 8760 hours.  ProBNP (last 3 results) No results for input(s): PROBNP in the last 8760 hours.  CBG: No results for input(s): GLUCAP in the last 168 hours.  Recent Results (from the past 240 hour(s))  Respiratory Panel by RT PCR (Flu A&B, Covid) - Nasopharyngeal Swab     Status: None   Collection Time: 08/11/19  9:25 PM   Specimen: Nasopharyngeal Swab  Result Value Ref Range Status   SARS Coronavirus 2 by RT PCR NEGATIVE NEGATIVE Final    Comment: (NOTE) SARS-CoV-2 target nucleic acids are NOT DETECTED. The SARS-CoV-2 RNA is generally detectable in upper respiratoy specimens during the acute phase of infection. The lowest concentration of SARS-CoV-2 viral copies this assay can detect is 131 copies/mL. A negative result does not preclude SARS-Cov-2 infection and should not be used as the sole basis for treatment or other patient management decisions. A negative result may occur with  improper specimen collection/handling, submission of specimen other than nasopharyngeal swab, presence of viral mutation(s) within the areas targeted by this assay, and inadequate number of viral copies (<131 copies/mL). A negative result must be combined with clinical observations, patient history, and epidemiological information. The expected result is Negative. Fact Sheet for Patients:  PinkCheek.be Fact Sheet for Healthcare Providers:  GravelBags.it This test is not yet ap proved or cleared by the [place] FDA and  has been authorized for detection and/or diagnosis of SARS-CoV-2 by FDA under an Emergency Use  Authorization (EUA). This EUA will remain  in effect (meaning this test can be used) for the duration of the COVID-19 declaration under Section 564(b)(1) of the Act, 21 U.S.C. section 360bbb-3(b)(1), unless the authorization is terminated or revoked sooner.    Influenza A by PCR NEGATIVE NEGATIVE Final   Influenza B by PCR NEGATIVE NEGATIVE Final  Comment: (NOTE) The Xpert Xpress SARS-CoV-2/FLU/RSV assay is intended as an aid in  the diagnosis of influenza from Nasopharyngeal swab specimens and  should not be used as a sole basis for treatment. Nasal washings and  aspirates are unacceptable for Xpert Xpress SARS-CoV-2/FLU/RSV  testing. Fact Sheet for Patients: PinkCheek.be Fact Sheet for Healthcare Providers: GravelBags.it This test is not yet approved or cleared by the [place] FDA and  has been authorized for detection and/or diagnosis of SARS-CoV-2 by  FDA under an Emergency Use Authorization (EUA). This EUA will remain  in effect (meaning this test can be used) for the duration of the  Covid-19 declaration under Section 564(b)(1) of the Act, 21  U.S.C. section 360bbb-3(b)(1), unless the authorization is  terminated or revoked. Performed at [facility], [place] [address], [place], [place] [address]      Studies: No results found.  Scheduled Meds: . Chlorhexidine Gluconate Cloth  6 each Topical Daily  . enoxaparin (LOVENOX) injection  40 mg Subcutaneous Q24H  . haloperidol  5 mg Oral QHS  . methocarbamol  750 mg Oral QID  . pantoprazole  40 mg Oral BID    Continuous Infusions: . ciprofloxacin 400 mg (08/16/19 1255)  . metronidazole 500 mg (08/16/19 1153)     Alma Friendly MD  [facility]    08/16/2019, 2:29 PM  LOS: 4 days

## 2019-08-17 MED ORDER — POLYETHYLENE GLYCOL 3350 17 G PO PACK
17.0000 g | PACK | Freq: Once | ORAL | Status: AC
  Administered 2019-08-17: 17 g via ORAL
  Filled 2019-08-17: qty 1

## 2019-08-17 MED ORDER — OXYCODONE HCL 5 MG PO TABS
5.0000 mg | ORAL_TABLET | ORAL | Status: DC | PRN
  Administered 2019-08-17 – 2019-08-18 (×4): 5 mg via ORAL
  Filled 2019-08-17 (×4): qty 1

## 2019-08-17 MED ORDER — METHOCARBAMOL 500 MG PO TABS
750.0000 mg | ORAL_TABLET | Freq: Four times a day (QID) | ORAL | Status: DC | PRN
  Administered 2019-08-17 – 2019-08-18 (×2): 750 mg via ORAL
  Filled 2019-08-17 (×2): qty 2

## 2019-08-17 NOTE — Progress Notes (Signed)
PROGRESS NOTE  Norman Vasquez OZH:[id number] DOB: 06/15/1995 DOA: 08/11/2019 PCP: Patient, No Pcp Per   HPI/Recap of past 24 hours Norman Vasquez is a 24 y.o. male with history of schizophrenia on Haldol and Cogentin intentionally swallowed a spoon and was brought to the ER from [place] [facility]. Patient states he is suicidal. Denies taking any drugs.  Patient admitted for further management.    Today, patient denies any new complaints.    Assessment/Plan: Principal Problem:   Foreign body in stomach Active Problems:   Suicidal ideations   Swallowed foreign body  Intentional ingestion of the foreign body s/p ex lap on 08/12/19 CT abdomen pelvis showed at least 2 or possibly 3 radiolucent foreign bodies compatible with reported ingestion of plastic spoons, separate linear radiolucent foreign body with punctate radiodense tip likely a ballpoint pen in the gastric lumen no perforation Underwent EGD, unable to retrieve the foreign body General surgery on board, s/p ex laparotomy and retrieval of foreign bodies on 3/15, D/C NG suction, advanced diet per surgery D/C iv cipro and flagyl, pain management per general surgery Monitor closely  Leukocytosis Resolved Likely reactive post op Afebrile Daily cbc  Schizophrenia  Currently denies suicidal/homicidal ideations Psychiatry consulted, recommend Haldol 5 mg nightly p.o. or IM, for increased agitation recommend as needed Haldol 5 mg and as needed Ativan 2 mg p.o. or IM.  Patient does not meet criteria for inpatient psych as he is from prison      DVT Prophylaxis: Lovenox  Code Status: Full  Family Communication: Discussed extensively with patient  Disposition Plan:    Patient came from:                     Prison                                                                                      Anticipated d/c place: TBD, as per general surgery, likely over this weekend  Barriers to d/c OR conditions which need  to be met to effect a safe d/c:  Need general surgery sign off   Consultants:  GI  General surgery  Psychiatry  Procedures:  EGD on March 15  Ex lap on March 15  Antibiotics:  Completed IV Cipro and Flagyl   Objective: BP 101/67 (BP Location: Left Arm)   Pulse (!) 109   Temp 98.1 F (36.7 C)   Resp 14   Ht 6' 2"  (1.88 m)   Wt 90.7 kg   SpO2 99%   BMI 25.67 kg/m   Intake/Output Summary (Last 24 hours) at 08/17/2019 1643 Last data filed at 08/16/2019 2300 Gross per 24 hour  Intake 600 ml  Output --  Net 600 ml   Filed Weights   08/12/19 0010 08/12/19 1015  Weight: 90.7 kg 90.7 kg    Exam:  General: NAD, handcuffs/leg chains noted  Cardiovascular: S1, S2 present  Respiratory: CTAB  Abdomen: Soft, non-tender, nondistended, bowel sounds present, incision C/D/I  Musculoskeletal: No bilateral pedal edema noted  Skin: Normal  Psychiatry: Normal mood    Data Reviewed: Basic Metabolic Panel: Recent Labs  Lab 08/12/19 0538 08/13/19 1440  08/14/19 0533 08/15/19 0557 08/16/19 0847  NA 139 140 136 137 135  K 4.4 3.9 3.9 3.6 3.9  CL 106 104 102 103 102  CO2 25 26 26 24 26   GLUCOSE 121* 97 111* 140* 99  BUN 13 10 11 14 14   CREATININE 0.90 0.64 0.83 0.70 0.80  CALCIUM 9.2 8.8* 8.9 9.1 9.2  MG  --   --  1.9  --   --    Liver Function Tests: Recent Labs  Lab 08/12/19 0538 08/13/19 1440 08/14/19 0533  AST 41 28 30  ALT 80* 61* 58*  ALKPHOS 63 59 53  BILITOT 0.7 1.0 0.8  PROT 8.0 7.5 7.2  ALBUMIN 3.6 3.5 3.3*   No results for input(s): LIPASE, AMYLASE in the last 168 hours. No results for input(s): AMMONIA in the last 168 hours. CBC: Recent Labs  Lab 08/11/19 2125 08/11/19 2125 08/12/19 0538 08/13/19 1440 08/14/19 0533 08/15/19 0557 08/16/19 0847  WBC 12.5*   < > 11.0* 13.4* 12.5* 13.2* 9.7  NEUTROABS 8.5*  --  9.6*  --  8.8* 9.7* 5.7  HGB 14.5   < > 14.1 14.1 14.1 15.7 14.8  HCT 45.9   < > 44.3 43.3 43.0 47.7 44.9  MCV 93.9   <  > 91.5 89.8 90.0 90.9 88.6  PLT 368   < > 333 318 292 316 333   < > = values in this interval not displayed.   Cardiac Enzymes:   No results for input(s): CKTOTAL, CKMB, CKMBINDEX, TROPONINI in the last 168 hours. BNP (last 3 results) No results for input(s): BNP in the last 8760 hours.  ProBNP (last 3 results) No results for input(s): PROBNP in the last 8760 hours.  CBG: No results for input(s): GLUCAP in the last 168 hours.  Recent Results (from the past 240 hour(s))  Respiratory Panel by RT PCR (Flu A&B, Covid) - Nasopharyngeal Swab     Status: None   Collection Time: 08/11/19  9:25 PM   Specimen: Nasopharyngeal Swab  Result Value Ref Range Status   SARS Coronavirus 2 by RT PCR NEGATIVE NEGATIVE Final    Comment: (NOTE) SARS-CoV-2 target nucleic acids are NOT DETECTED. The SARS-CoV-2 RNA is generally detectable in upper respiratoy specimens during the acute phase of infection. The lowest concentration of SARS-CoV-2 viral copies this assay can detect is 131 copies/mL. A negative result does not preclude SARS-Cov-2 infection and should not be used as the sole basis for treatment or other patient management decisions. A negative result may occur with  improper specimen collection/handling, submission of specimen other than nasopharyngeal swab, presence of viral mutation(s) within the areas targeted by this assay, and inadequate number of viral copies (<131 copies/mL). A negative result must be combined with clinical observations, patient history, and epidemiological information. The expected result is Negative. Fact Sheet for Patients:  PinkCheek.be Fact Sheet for Healthcare Providers:  GravelBags.it This test is not yet ap proved or cleared by the [place] FDA and  has been authorized for detection and/or diagnosis of SARS-CoV-2 by FDA under an Emergency Use Authorization (EUA). This EUA will remain  in effect  (meaning this test can be used) for the duration of the COVID-19 declaration under Section 564(b)(1) of the Act, 21 U.S.C. section 360bbb-3(b)(1), unless the authorization is terminated or revoked sooner.    Influenza A by PCR NEGATIVE NEGATIVE Final   Influenza B by PCR NEGATIVE NEGATIVE Final    Comment: (NOTE) The Xpert Xpress SARS-CoV-2/FLU/RSV assay  is intended as an aid in  the diagnosis of influenza from Nasopharyngeal swab specimens and  should not be used as a sole basis for treatment. Nasal washings and  aspirates are unacceptable for Xpert Xpress SARS-CoV-2/FLU/RSV  testing. Fact Sheet for Patients: PinkCheek.be Fact Sheet for Healthcare Providers: GravelBags.it This test is not yet approved or cleared by the [place] FDA and  has been authorized for detection and/or diagnosis of SARS-CoV-2 by  FDA under an Emergency Use Authorization (EUA). This EUA will remain  in effect (meaning this test can be used) for the duration of the  Covid-19 declaration under Section 564(b)(1) of the Act, 21  U.S.C. section 360bbb-3(b)(1), unless the authorization is  terminated or revoked. Performed at [facility], [place] [address], [place], [place] [address]      Studies: No results found.  Scheduled Meds: . Chlorhexidine Gluconate Cloth  6 each Topical Daily  . enoxaparin (LOVENOX) injection  40 mg Subcutaneous Q24H  . haloperidol  5 mg Oral QHS  . pantoprazole  40 mg Oral BID    Continuous Infusions:    Alma Friendly MD  [facility]    08/17/2019, 4:43 PM  LOS: 5 days

## 2019-08-17 NOTE — Progress Notes (Signed)
5 Days Post-Op   Subjective/Chief Complaint: Tolerating regular diet Flatus, but no BM Pain - still asking for IV dilaudid   Objective: Vital signs in last 24 hours: Temp:  [97.6 F (36.4 C)-98.8 F (37.1 C)] 97.9 F (36.6 C) (03/20 0737) Pulse Rate:  [86-107] 86 (03/20 0737) Resp:  [14-18] 14 (03/20 0737) BP: (99-116)/(57-88) 114/74 (03/20 0737) SpO2:  [98 %-100 %] 99 % (03/20 0737) Last BM Date: 08/11/19  Intake/Output from previous day: 03/19 0701 - 03/20 0700 In: 1200 [P.O.:1200] Out: -  Intake/Output this shift: No intake/output data recorded.  General appearance: alert, cooperative, no distress and restrained to bed by law enforcement GI: soft, non-distended, incisional tenderness Staple line c/d/i - no drainage  Lab Results:  Recent Labs    08/15/19 0557 08/16/19 0847  WBC 13.2* 9.7  HGB 15.7 14.8  HCT 47.7 44.9  PLT 316 333   BMET Recent Labs    08/15/19 0557 08/16/19 0847  NA 137 135  K 3.6 3.9  CL 103 102  CO2 24 26  GLUCOSE 140* 99  BUN 14 14  CREATININE 0.70 0.80  CALCIUM 9.1 9.2   PT/INR No results for input(s): LABPROT, INR in the last 72 hours. ABG No results for input(s): PHART, HCO3 in the last 72 hours.  Invalid input(s): PCO2, PO2  Studies/Results: DG UGI W SINGLE CM (SOL OR THIN BA)  Result Date: 08/15/2019 CLINICAL DATA:  Postop day 2 for exploratory laparotomy with surgical removal of foreign bodies from the stomach. EXAM: UPPER GI SERIES WITH KUB TECHNIQUE: After obtaining a scout radiograph a routine upper GI series was performed using water-soluble contrast (Omnipaque) FLUOROSCOPY TIME:  Fluoroscopy Time:  2 minutes 12 seconds Radiation Exposure Index (if provided by the fluoroscopic device): 43.7 mGy Number of Acquired Spot Images: 8 COMPARISON:  CT 08/12/2019 FINDINGS: Patient was too weak to stand and therefore was examined in semi upright position (approximately 45 degrees). Patient did have limited mobility due to  sedation. Contrast was injected through the NG tube. Sixty cc of contrast injected into the stomach. Contrast filled the stomach without evidence of extraluminal leak. Patient was placed in RIGHT-side-down position and contrast emptied into the duodenum. Subsequently contrast travels into the small bowel. No evidence of leak from the stomach. IMPRESSION: No evidence of leak of water-soluble contrast from the stomach on somewhat limited exam. Contrast travels to the ligament Treitz and and beyond. These results will be called to the ordering clinician or representative by the Radiologist Assistant, and communication documented in the PACS or [employer]. Electronically Signed   By: Suzy Bouchard M.D.   On: 08/15/2019 09:37    Anti-infectives: Anti-infectives (From admission, onward)   Start     Dose/Rate Route Frequency Ordered Stop   08/12/19 2300  ciprofloxacin (CIPRO) IVPB 400 mg  Status:  Discontinued     400 mg 200 mL/hr over 60 Minutes Intravenous Every 12 hours 08/12/19 1507 08/17/19 0804   08/12/19 1800  metroNIDAZOLE (FLAGYL) IVPB 500 mg  Status:  Discontinued     500 mg 100 mL/hr over 60 Minutes Intravenous Every 8 hours 08/12/19 1507 08/17/19 0804   08/12/19 1030  metroNIDAZOLE (FLAGYL) IVPB 500 mg     500 mg 100 mL/hr over 60 Minutes Intravenous On call to O.R. 08/12/19 1018 08/12/19 1133   08/12/19 1030  ciprofloxacin (CIPRO) IVPB 400 mg     400 mg 200 mL/hr over 60 Minutes Intravenous On call to O.R. 08/12/19 1018 08/12/19 1112  Assessment/Plan: s/p Procedure(s): EXPLORATORY LAPAROTOMY , GASTRorrhaphy, REMOVAL OF FOREIGN BODIES (N/A) PO pain meds only D/C abx Probable [facility] 3/21   LOS: 5 days    Wynona Luna 08/17/2019

## 2019-08-18 MED ORDER — OXYCODONE HCL 5 MG PO TABS: 5.0000 mg | ORAL_TABLET | Freq: Four times a day (QID) | ORAL | 0 refills | Status: AC | PRN

## 2019-08-18 MED ORDER — HALOPERIDOL 5 MG PO TABS: 5.0000 mg | ORAL_TABLET | Freq: Every day | ORAL | 0 refills | Status: AC

## 2019-08-18 NOTE — Final Consult Note (Signed)
Consultant Final Sign-Off Note    Assessment/Final recommendations  Norman Vasquez is a 24 y.o. male followed by me for exploratory laparotomy/ gastrorrhaphy/ removal of foreign bodies.   Wound care (if applicable): Patient may shower over the staples.  No dressings needed.  Follow-up appointment for staple removal is in his discharge paperwork.   Diet at discharge: ad lib   Activity at discharge: Patient may ambulate as allowed.  May sit up in chair as allowed.  No heavy lifting over 20 lbs.   Follow-up appointment:   [facility] 08/21/19 at 10:00 for staple removal  [facility] 09/10/19 for final post-op appointment  Medication recommendations: Oxycodone 5-10 mg PO q6 PRN pain.  May also use Tylenol 650 mg PO q6 PRN pain along with the oxycodone.  Other recommendations:    Thank you for allowing [place] to participate in the care of your patient!  Please consult [place] again if you have further needs for your patient.  Wilmon Arms Janet Decesare 08/18/2019 8:05 AM    Subjective  Flatus, no BM yet   Objective  Vital signs in last 24 hours: Temp:  [97.3 F (36.3 C)-98.1 F (36.7 C)] 97.3 F (36.3 C) (03/21 0446) Pulse Rate:  [99-109] 101 (03/21 0446) Resp:  [14-20] 20 (03/21 0446) BP: (101-125)/(67-84) 125/84 (03/21 0446) SpO2:  [98 %-100 %] 100 % (03/21 0446)  General:NAD Abd - soft, non-distended, active bowel sounds Incision - staples c/d/i; no drainage   Pertinent labs and Studies: Recent Labs    08/16/19 0847  WBC 9.7  HGB 14.8  HCT 44.9   BMET Recent Labs    08/16/19 0847  NA 135  K 3.9  CL 102  CO2 26  GLUCOSE 99  BUN 14  CREATININE 0.80  CALCIUM 9.2   No results for input(s): LABURIN in the last 72 hours. Results for orders placed or performed during the hospital encounter of 08/11/19  Respiratory Panel by RT PCR (Flu A&B, Covid) - Nasopharyngeal Swab     Status: None   Collection Time: 08/11/19  9:25 PM   Specimen:  Nasopharyngeal Swab  Result Value Ref Range Status   SARS Coronavirus 2 by RT PCR NEGATIVE NEGATIVE Final    Comment: (NOTE) SARS-CoV-2 target nucleic acids are NOT DETECTED. The SARS-CoV-2 RNA is generally detectable in upper respiratoy specimens during the acute phase of infection. The lowest concentration of SARS-CoV-2 viral copies this assay can detect is 131 copies/mL. A negative result does not preclude SARS-Cov-2 infection and should not be used as the sole basis for treatment or other patient management decisions. A negative result may occur with  improper specimen collection/handling, submission of specimen other than nasopharyngeal swab, presence of viral mutation(s) within the areas targeted by this assay, and inadequate number of viral copies (<131 copies/mL). A negative result must be combined with clinical observations, patient history, and epidemiological information. The expected result is Negative. Fact Sheet for Patients:  https://www.moore.com/ Fact Sheet for Healthcare Providers:  https://www.young.biz/ This test is not yet ap proved or cleared by the [place] FDA and  has been authorized for detection and/or diagnosis of SARS-CoV-2 by FDA under an Emergency Use Authorization (EUA). This EUA will remain  in effect (meaning this test can be used) for the duration of the COVID-19 declaration under Section 564(b)(1) of the Act, 21 U.S.C. section 360bbb-3(b)(1), unless the authorization is terminated or revoked sooner.    Influenza A by PCR NEGATIVE NEGATIVE Final   Influenza B by  PCR NEGATIVE NEGATIVE Final    Comment: (NOTE) The Xpert Xpress SARS-CoV-2/FLU/RSV assay is intended as an aid in  the diagnosis of influenza from Nasopharyngeal swab specimens and  should not be used as a sole basis for treatment. Nasal washings and  aspirates are unacceptable for Xpert Xpress SARS-CoV-2/FLU/RSV  testing. Fact Sheet for  Patients: PinkCheek.be Fact Sheet for Healthcare Providers: GravelBags.it This test is not yet approved or cleared by the [place] FDA and  has been authorized for detection and/or diagnosis of SARS-CoV-2 by  FDA under an Emergency Use Authorization (EUA). This EUA will remain  in effect (meaning this test can be used) for the duration of the  Covid-19 declaration under Section 564(b)(1) of the Act, 21  U.S.C. section 360bbb-3(b)(1), unless the authorization is  terminated or revoked. Performed at [facility], [place] [address] [place], [place] [address]     Imaging: No results found.

## 2019-08-18 NOTE — Discharge Summary (Signed)
Discharge Summary  Norman Vasquez DUK:[id number] DOB: [birth year]  PCP: Patient, No Pcp Per  Admit date: 08/11/2019 Discharge date: 08/18/2019  Time spent: 35 mins  Recommendations for Outpatient Follow-up:  1. General surgery as scheduled  Discharge Diagnoses:  Active Hospital Problems   Diagnosis Date Noted  . Foreign body in stomach 08/12/2019  . Suicidal ideations 08/12/2019  . Swallowed foreign body     Resolved Hospital Problems  No resolved problems to display.    Discharge Condition: Stable  Diet recommendation: Regular  Vitals:   08/18/19 0446 08/18/19 1106  BP: 125/84 123/74  Pulse: (!) 101 (!) 117  Resp: 20 20  Temp: (!) 97.3 F (36.3 C) 97.7 F (36.5 C)  SpO2: 100% 100%    History of present illness:  Norman Muckenfussis a 24 y.o.malewithhistory of schizophrenia on Haldol and Cogentin intentionally swallowed a spoon and was brought to the ER from [place] [facility].Patient states he is suicidal. Denies taking any drugs.  Patient admitted for further management.    Today, patient denies any new complaints.  Stable to be discharged from a general surgery standpoint.  Follow-up scheduled by general surgery.    Hospital Course:  Principal Problem:   Foreign body in stomach Active Problems:   Suicidal ideations   Swallowed foreign body   Intentional ingestion of the foreign body s/p ex lap on 08/12/19 CT abdomen pelvis showed at least 2 or possibly 3 radiolucent foreign bodies compatible with reported ingestion of plastic spoons, separate linear radiolucent foreign body with punctate radiodense tip likely a ballpoint pen in the gastric lumen no perforation Underwent EGD, unable to retrieve the foreign body General surgery on board, s/p ex laparotomy and retrieval of foreign bodies on 3/15 Stable to [facility] back [facility] from a general surgery standpoint Follow-up with general surgery  Leukocytosis Resolved Likely reactive post  op Afebrile  Schizophrenia  Currently denies suicidal/homicidal ideations Psychiatry consulted, recommend Haldol 5 mg nightly p.o. or IM, for increased agitation recommend as needed Haldol 5 mg and as needed Ativan 2 mg p.o. or IM. Patient does not meet criteria for inpatient psych as he is from prison Follow-up with psychiatry as an outpatient for further management of his schizophrenia       Malnutrition Type:      Malnutrition Characteristics:      Nutrition Interventions:      Estimated body mass index is 25.67 kg/m as calculated from the following:   Height as of this encounter: 6\' 2"  (1.88 m).   Weight as of this encounter: 90.7 kg.    Procedures:  Ex lap  EGD  Consultations:  General surgery  GI  Psychiatry  Discharge Exam: BP 123/74 (BP Location: Left Arm)   Pulse (!) 117   Temp 97.7 F (36.5 C) (Oral)   Resp 20   Ht 6\' 2"  (1.88 m)   Wt 90.7 kg   SpO2 100%   BMI 25.67 kg/m   General: NAD Cardiovascular: S1, S2 present Abdomen: Soft, nontender, nondistended, bowel sounds present, incision C/D/I Respiratory: CTA B  Discharge Instructions You were cared for by a hospitalist during your hospital stay. If you have any questions about your discharge medications or the care you received while you were in the hospital after you are discharged, you can call the unit and asked to speak with the hospitalist on call if the hospitalist that took care of you is not available. Once you are discharged, your primary care physician will handle any further medical  issues. Please note that NO REFILLS for any discharge medications will be authorized once you are discharged, as it is imperative that you return to your primary care physician (or establish a relationship with a primary care physician if you do not have one) for your aftercare needs so that they can reassess your need for medications and monitor your lab values.   Allergies as of 08/18/2019       Reactions   Iodinated Diagnostic Agents Itching, Rash   Amoxicillin    Penicillins    Did it involve swelling of the face/tongue/throat, SOB, or low BP? Unknown Did it involve sudden or severe rash/hives, skin peeling, or any reaction on the inside of your mouth or nose? Unknown Did you need to seek medical attention at a hospital or doctor's office? Unknown When did it last happen? If all above answers are "NO", may proceed with cephalosporin use.   Tylenol [acetaminophen]       Medication List    TAKE these medications   oxyCODONE 5 MG immediate release tablet Commonly known as: Oxy IR/ROXICODONE Take 1-2 tablets (5-10 mg total) by mouth every 6 (six) hours as needed for moderate pain, severe pain or breakthrough pain.      Allergies  Allergen Reactions  . Iodinated Diagnostic Agents Itching and Rash  . Amoxicillin   . Penicillins     Did it involve swelling of the face/tongue/throat, SOB, or low BP? Unknown Did it involve sudden or severe rash/hives, skin peeling, or any reaction on the inside of your mouth or nose? Unknown Did you need to seek medical attention at a hospital or doctor's office? Unknown When did it last happen? If all above answers are "NO", may proceed with cephalosporin use.   . Tylenol [Acetaminophen]    Follow-up Information    [facility], [place]. Call on 08/21/2019.   Specialty: General Surgery Why: staple removal 3/24 at 10AM Be at the office 30 minutes early for check in.  Bring photo ID and insurance information. You have a second appointment on 09/10/19 at 9AM with our Physician Assistant.  Contact information: [address] [place] [address] [phone]           The results of significant diagnostics from this hospitalization (including imaging, microbiology, ancillary and laboratory) are listed below for reference.    Significant Diagnostic Studies: CT ABDOMEN PELVIS WO  CONTRAST  Result Date: 08/12/2019 CLINICAL DATA:  Gastric foreign body EXAM: CT ABDOMEN AND PELVIS WITHOUT CONTRAST TECHNIQUE: Multidetector CT imaging of the abdomen and pelvis was performed following the standard protocol without IV contrast. COMPARISON:  None. FINDINGS: Lower chest: Lung bases are clear. Normal heart size. No pericardial effusion. Hepatobiliary: No visible or contour deforming hepatic lesions. Smooth liver surface contour. Incidental note is made of a small amount of gas in the common bile duct as well as a layering air-fluid level within the gallbladder. No frank gallbladder wall thickening or pericholecystic fluid is seen. No visible calcified gallstones either within the gallbladder lumen or in the biliary tree. Pancreas: Unremarkable. No pancreatic ductal dilatation or surrounding inflammatory changes. Spleen: Normal in size without focal abnormality. Adrenals/Urinary Tract: Adrenal glands are unremarkable. Kidneys are normal, without renal calculi, focal lesion, or hydronephrosis. Bladder is unremarkable. Stomach/Bowel: Distal esophagus is unremarkable. There are at least 2, possibly 3 radiolucent foreign bodies compatible with reported ingestion of plastic spoons which appear contained within the gastric body. Two of these may be stacked or this  may be artifactual related to motion artifact. Immediately adjacent is a more elongated linear foreign body with a radiodense tip which may reflect a ball point pen, assessment of the foreign body is complicated by motion artifact secondary to gastric peristalsis. Foreign bodies appear pre pyloric at this time. No distal foreign bodies are evident though small radiolucent foreign bodies may be difficult to assess. Duodenum takes a normal sweep across the abdomen. No small bowel dilatation or wall thickening. A normal appendix is visualized. No colonic dilatation or wall thickening. Vascular/Lymphatic: The aorta is normal caliber. No suspicious or  enlarged lymph nodes in the included lymphatic chains. Reproductive: The prostate and seminal vesicles are unremarkable. Other: No evidence of abdominopelvic free air or free fluid. No features to suggest a hollow viscus perforation at this time Musculoskeletal: No acute osseous abnormality or suspicious osseous lesion. Transitional lumbosacral anatomy. IMPRESSION: 1. There are at least 2, possibly 3 radiolucent foreign bodies compatible with reported ingestion of plastic spoons which appear contained within the gastric body. Two of these may be stacked or this may be artifactual related to motion artifact. 2. Separate linear radiolucent foreign body with punctate radiodense tip likely a ball point pen also within the gastric lumen. 3. No evidence of hollow viscus perforation. No distal foreign bodies are evident at this time. 4. Incidental note is made of a small amount of gas in the common bile duct as well as a layering air-fluid level within the gallbladder. Correlate for recent manipulation. Electronically Signed   By: Kreg Shropshire M.D.   On: 08/12/2019 05:41   DG Neck Soft Tissue  Result Date: 08/11/2019 CLINICAL DATA:  Rule out foreign body. EXAM: NECK SOFT TISSUES - 1+ VIEW COMPARISON:  None. FINDINGS: There is no evidence of retropharyngeal soft tissue swelling or epiglottic enlargement. The cervical airway is unremarkable and no radio-opaque foreign body identified. IMPRESSION: Negative. Electronically Signed   By: Aram Candela M.D.   On: 08/11/2019 19:28   DG Chest Portable 1 View  Result Date: 08/11/2019 CLINICAL DATA:  Rule out foreign body. EXAM: PORTABLE CHEST 1 VIEW COMPARISON:  Oct 16, 2018 FINDINGS: The heart size and mediastinal contours are within normal limits. Both lungs are clear. The visualized skeletal structures are unremarkable. A 6 mm radiopaque foreign body is seen overlying the right upper quadrant. This represents a new finding when compared to the prior study. IMPRESSION:  6 mm radiopaque foreign body overlying the right upper quadrant. This represents a new finding when compared to the prior study and may be consistent with interval cholecystectomy. Correlation with the patient's surgical history is recommended. Electronically Signed   By: Aram Candela M.D.   On: 08/11/2019 19:27   DG UGI W SINGLE CM (SOL OR THIN BA)  Result Date: 08/15/2019 CLINICAL DATA:  Postop day 2 for exploratory laparotomy with surgical removal of foreign bodies from the stomach. EXAM: UPPER GI SERIES WITH KUB TECHNIQUE: After obtaining a scout radiograph a routine upper GI series was performed using water-soluble contrast (Omnipaque) FLUOROSCOPY TIME:  Fluoroscopy Time:  2 minutes 12 seconds Radiation Exposure Index (if provided by the fluoroscopic device): 43.7 mGy Number of Acquired Spot Images: 8 COMPARISON:  CT 08/12/2019 FINDINGS: Patient was too weak to stand and therefore was examined in semi upright position (approximately 45 degrees). Patient did have limited mobility due to sedation. Contrast was injected through the NG tube. Sixty cc of contrast injected into the stomach. Contrast filled the stomach without evidence of extraluminal leak.  Patient was placed in RIGHT-side-down position and contrast emptied into the duodenum. Subsequently contrast travels into the small bowel. No evidence of leak from the stomach. IMPRESSION: No evidence of leak of water-soluble contrast from the stomach on somewhat limited exam. Contrast travels to the ligament Treitz and and beyond. These results will be called to the ordering clinician or representative by the Radiologist Assistant, and communication documented in the PACS or [employer]. Electronically Signed   By: Genevive Bi M.D.   On: 08/15/2019 09:37    Microbiology: Recent Results (from the past 240 hour(s))  Respiratory Panel by RT PCR (Flu A&B, Covid) - Nasopharyngeal Swab     Status: None   Collection Time: 08/11/19  9:25 PM    Specimen: Nasopharyngeal Swab  Result Value Ref Range Status   SARS Coronavirus 2 by RT PCR NEGATIVE NEGATIVE Final    Comment: (NOTE) SARS-CoV-2 target nucleic acids are NOT DETECTED. The SARS-CoV-2 RNA is generally detectable in upper respiratoy specimens during the acute phase of infection. The lowest concentration of SARS-CoV-2 viral copies this assay can detect is 131 copies/mL. A negative result does not preclude SARS-Cov-2 infection and should not be used as the sole basis for treatment or other patient management decisions. A negative result may occur with  improper specimen collection/handling, submission of specimen other than nasopharyngeal swab, presence of viral mutation(s) within the areas targeted by this assay, and inadequate number of viral copies (<131 copies/mL). A negative result must be combined with clinical observations, patient history, and epidemiological information. The expected result is Negative. Fact Sheet for Patients:  https://www.moore.com/ Fact Sheet for Healthcare Providers:  https://www.young.biz/ This test is not yet ap proved or cleared by the [place] FDA and  has been authorized for detection and/or diagnosis of SARS-CoV-2 by FDA under an Emergency Use Authorization (EUA). This EUA will remain  in effect (meaning this test can be used) for the duration of the COVID-19 declaration under Section 564(b)(1) of the Act, 21 U.S.C. section 360bbb-3(b)(1), unless the authorization is terminated or revoked sooner.    Influenza A by PCR NEGATIVE NEGATIVE Final   Influenza B by PCR NEGATIVE NEGATIVE Final    Comment: (NOTE) The Xpert Xpress SARS-CoV-2/FLU/RSV assay is intended as an aid in  the diagnosis of influenza from Nasopharyngeal swab specimens and  should not be used as a sole basis for treatment. Nasal washings and  aspirates are unacceptable for Xpert Xpress SARS-CoV-2/FLU/RSV  testing. Fact Sheet  for Patients: https://www.moore.com/ Fact Sheet for Healthcare Providers: https://www.young.biz/ This test is not yet approved or cleared by the [place] FDA and  has been authorized for detection and/or diagnosis of SARS-CoV-2 by  FDA under an Emergency Use Authorization (EUA). This EUA will remain  in effect (meaning this test can be used) for the duration of the  Covid-19 declaration under Section 564(b)(1) of the Act, 21  U.S.C. section 360bbb-3(b)(1), unless the authorization is  terminated or revoked. Performed at [facility], [address] [place], [place] [address]      Labs: Basic Metabolic Panel: Recent Labs  Lab 08/12/19 0538 08/13/19 1440 08/14/19 0533 08/15/19 0557 08/16/19 0847  NA 139 140 136 137 135  K 4.4 3.9 3.9 3.6 3.9  CL 106 104 102 103 102  CO2 25 26 26 24 26   GLUCOSE 121* 97 111* 140* 99  BUN 13 10 11 14 14   CREATININE 0.90 0.64 0.83 0.70 0.80  CALCIUM 9.2 8.8* 8.9 9.1 9.2  MG  --   --  1.9  --   --    Liver Function Tests: Recent Labs  Lab 08/12/19 0538 08/13/19 1440 08/14/19 0533  AST 41 28 30  ALT 80* 61* 58*  ALKPHOS 63 59 53  BILITOT 0.7 1.0 0.8  PROT 8.0 7.5 7.2  ALBUMIN 3.6 3.5 3.3*   No results for input(s): LIPASE, AMYLASE in the last 168 hours. No results for input(s): AMMONIA in the last 168 hours. CBC: Recent Labs  Lab 08/11/19 2125 08/11/19 2125 08/12/19 0538 08/13/19 1440 08/14/19 0533 08/15/19 0557 08/16/19 0847  WBC 12.5*   < > 11.0* 13.4* 12.5* 13.2* 9.7  NEUTROABS 8.5*  --  9.6*  --  8.8* 9.7* 5.7  HGB 14.5   < > 14.1 14.1 14.1 15.7 14.8  HCT 45.9   < > 44.3 43.3 43.0 47.7 44.9  MCV 93.9   < > 91.5 89.8 90.0 90.9 88.6  PLT 368   < > 333 318 292 316 333   < > = values in this interval not displayed.   Cardiac Enzymes: No results for input(s): CKTOTAL, CKMB, CKMBINDEX, TROPONINI in the last 168 hours. BNP: BNP (last 3 results) No results for  input(s): BNP in the last 8760 hours.  ProBNP (last 3 results) No results for input(s): PROBNP in the last 8760 hours.  CBG: No results for input(s): GLUCAP in the last 168 hours.     Signed:  Briant CedarNkeiruka J Thaer Miyoshi, MD [facility] 08/18/2019, 11:12 AM

## 2019-08-18 NOTE — Progress Notes (Signed)
Nsg Discharge Note  Admit Date:  08/11/2019 Discharge date: 08/18/2019   Other [facility] to be D/C[facility] per MD order.  AVS completed.  Copy for chart, and copy for patient signed, and dated. Patient/caregiver able to verbalize understanding.  Discharge Medication: Allergies as of 08/18/2019      Reactions   Iodinated Diagnostic Agents Itching, Rash   Amoxicillin    Penicillins    Did it involve swelling of the face/tongue/throat, SOB, or low BP? Unknown Did it involve sudden or severe rash/hives, skin peeling, or any reaction on the inside of your mouth or nose? Unknown Did you need to seek medical attention at a hospital or doctor's office? Unknown When did it last happen? If all above answers are "NO", may proceed with cephalosporin use.   Tylenol [acetaminophen]       Medication List    TAKE these medications   oxyCODONE 5 MG immediate release tablet Commonly known as: Oxy IR/ROXICODONE Take 1-2 tablets (5-10 mg total) by mouth every 6 (six) hours as needed for moderate pain, severe pain or breakthrough pain.       Discharge Assessment: Vitals:   08/18/19 0446 08/18/19 1106  BP: 125/84 123/74  Pulse: (!) 101 (!) 117  Resp: 20 20  Temp: (!) 97.3 F (36.3 C) 97.7 F (36.5 C)  SpO2: 100% 100%   Skin clean, dry and intact without evidence of skin break down, no evidence of skin tears noted. IV catheter discontinued intact. Site without signs and symptoms of complications - no redness or edema noted at insertion site, patient denies c/o pain - only slight tenderness at site.  Dressing with slight pressure applied.  D/c Instructions-Education: Discharge instructions given to patient/family with verbalized understanding. D/c education completed with patient/family including follow up instructions, medication list, d/c activities limitations if indicated, with other d/c instructions as indicated by MD - patient able to verbalize understanding, all questions fully  answered. Patient instructed to return to ED, call 911, or call MD for any changes in condition.  Patient escorted via WC, and D/C back to prison.   Robert Bellow, RN 08/18/2019 11:15 AM

## 2019-08-19 ENCOUNTER — Other Ambulatory Visit: Payer: Self-pay

## 2019-08-19 ENCOUNTER — Emergency Department (HOSPITAL_COMMUNITY)
Admission: EM | Admit: 2019-08-19 | Discharge: 2019-08-19 | Attending: Emergency Medicine | Admitting: Emergency Medicine

## 2019-08-19 DIAGNOSIS — Z9889 Other specified postprocedural states: Secondary | ICD-10-CM | POA: Insufficient documentation

## 2019-08-19 DIAGNOSIS — S31109D Unspecified open wound of abdominal wall, unspecified quadrant without penetration into peritoneal cavity, subsequent encounter: Secondary | ICD-10-CM | POA: Diagnosis not present

## 2019-08-19 DIAGNOSIS — X58XXXD Exposure to other specified factors, subsequent encounter: Secondary | ICD-10-CM | POA: Insufficient documentation

## 2019-08-19 DIAGNOSIS — S31109A Unspecified open wound of abdominal wall, unspecified quadrant without penetration into peritoneal cavity, initial encounter: Secondary | ICD-10-CM

## 2019-08-19 NOTE — ED Triage Notes (Signed)
24 [facility]p abd surgery last week to remove pins that he swallowed. Pt pulled out sutures today. MD at jail placed steri strips and abd pad per EMS. No bleeding noted at this time. Currently in custody.

## 2019-08-19 NOTE — ED Provider Notes (Signed)
[facility]   CSN: [id number] Arrival date & time: 08/19/19  1510     History Chief Complaint  Patient presents with  . pulled out sutures    Norman Vasquez is a 24 y.o. male.  HPI   Patient presents from jail due to concern of a wound dehiscence.  Patient has a history of schizophrenia, had procedure 1 week ago with open exploratory laparotomy after he initially presented due to concern for multiple swallowed foreign bodies.  Patient was discharged yesterday, today at jail he was found to have removed many of the staples holding his midline surgical incision in place.  Patient self is aware of this, notes that he has a history of self harming behavior.  He denies other complaints, denies fever, and staff deny other notable changes in his condition including awareness of abnormal vitals, complaints, concerns.  Patient has pain in the midline area, no medication given for relief, symptoms seem worse with activity.  Past Medical History:  Diagnosis Date  . Schizophrenia [facility])     Patient Active Problem List   Diagnosis Date Noted  . Foreign body in stomach 08/12/2019  . Suicidal ideations 08/12/2019  . Swallowed foreign body     Past Surgical History:  Procedure Laterality Date  . ESOPHAGOGASTRODUODENOSCOPY N/A 08/12/2019   Procedure: ESOPHAGOGASTRODUODENOSCOPY (EGD);  Surgeon: Rachael Fee, MD;  Location: [facility];  Service: Endoscopy;  Laterality: N/A;  . FOREIGN BODY REMOVAL N/A 08/12/2019   Procedure: FOREIGN BODY REMOVAL;  Surgeon: Rachael Fee, MD;  Location: [facility]COPY;  Service: Endoscopy;  Laterality: N/A;  . INNER EAR SURGERY    . LAPAROTOMY N/A 08/12/2019   Procedure: EXPLORATORY LAPAROTOMY , GASTRorrhaphy, REMOVAL OF FOREIGN BODIES;  Surgeon: Harriette Bouillon, MD;  Location: [facility] ORS;  Service: General;  Laterality: N/A;       Family History  Problem Relation Age of Onset  . Schizophrenia Neg Hx      Social History   Tobacco Use  . Smoking status: Never Smoker  . Smokeless tobacco: Never Used  Substance Use Topics  . Alcohol use: Not Currently  . Drug use: Not on file    Home Medications Prior to Admission medications   Medication Sig Start Date End Date Taking? Authorizing Provider  haloperidol (HALDOL) 5 MG tablet Take 1 tablet (5 mg total) by mouth at bedtime. 08/18/19 09/17/19  Briant Cedar, MD  oxyCODONE (OXY IR/ROXICODONE) 5 MG immediate release tablet Take 1-2 tablets (5-10 mg total) by mouth every 6 (six) hours as needed for moderate pain, severe pain or breakthrough pain. 08/18/19   Manus Rudd, MD    Allergies    Iodinated diagnostic agents, Amoxicillin, Penicillins, and Tylenol [acetaminophen]  Review of Systems   Review of Systems  Constitutional:       Per HPI, otherwise negative  HENT:       Per HPI, otherwise negative  Respiratory:       Per HPI, otherwise negative  Cardiovascular:       Per HPI, otherwise negative  Gastrointestinal: Negative for vomiting.  Endocrine:       Negative aside from HPI  Genitourinary:       Neg aside from HPI   Musculoskeletal:       Per HPI, otherwise negative  Skin: Positive for color change and wound.  Neurological: Negative for syncope.    Physical Exam Updated Vital Signs BP 116/85 (BP Location: Right Arm)   Pulse 100  Temp 97.7 F (36.5 C) (Oral)   Resp 16   Ht 6\' 2"  (1.88 m)   Wt 90.7 kg   SpO2 98%   BMI 25.68 kg/m   Physical Exam Vitals and nursing note reviewed.  Constitutional:      General: He is not in acute distress.    Appearance: He is well-developed.  HENT:     Head: Normocephalic and atraumatic.  Eyes:     Conjunctiva/sclera: Conjunctivae normal.  Cardiovascular:     Rate and Rhythm: Normal rate and regular rhythm.  Pulmonary:     Effort: Pulmonary effort is normal. No respiratory distress.     Breath sounds: No stridor.  Abdominal:     General: There is no distension.     Skin:    General: Skin is warm and dry.  Neurological:     Mental Status: He is alert and oriented to person, place, and time.     ED Results / Procedures / Treatments    After the initial evaluation I reviewed the patient's chart including documentation from surgical repair last week, his discharge yesterday.  Procedures Procedures (including critical care time)  Medications Ordered in ED Medications - No data to display  ED Course  I have reviewed the triage vital signs and the nursing notes.  Pertinent labs & imaging results that were available during my care of the patient were reviewed by me and considered in my medical decision making (see chart for details).    After the initial evaluation I discussed the patient's case with our surgical colleagues.  Patient was on the surgery service until yesterday when he was discharged.  Patient has been seen and evaluated by surgery, appropriate wound care recommendations have been made, absent evidence for dehiscence of the internal sutures, and with closure with Steri-Strips, patient was discharged with instructions to follow-up in the clinic.  Final Clinical Impression(s) / ED Diagnoses Final diagnoses:  Open wound of anterior abdominal wall with complication, initial encounter     Carmin Muskrat, MD 08/19/19 [phone]

## 2019-08-19 NOTE — Progress Notes (Signed)
[facility] Progress Note     Subjective: Patient brought to ED after ripping staples out. Small amount of bleeding, steri-strips were applied. Patient reports pain at incision.   Objective: Vital signs in last 24 hours: Temp:  [97.7 F (36.5 C)] 97.7 F (36.5 C) (03/22 1526) Pulse Rate:  [100] 100 (03/22 1526) Resp:  [16] 16 (03/22 1526) BP: (116)/(85) 116/85 (03/22 1526) SpO2:  [96 %-98 %] 98 % (03/22 1526) Weight:  [90.7 kg] 90.7 kg (03/22 1530)    Intake/Output from previous day: No intake/output data recorded. Intake/Output this shift: No intake/output data recorded.  PE: General: cooperative, NAD Abd: soft, NT, ND, +BS, midline wound with staples present on one side and clean granulation tissue, small amount bloody drainage in wound base, no evidence of evisceration, no purulence or signs of infection    Lab Results:  No results for input(s): WBC, HGB, HCT, PLT in the last 72 hours. BMET No results for input(s): NA, K, CL, CO2, GLUCOSE, BUN, CREATININE, CALCIUM in the last 72 hours. PT/INR No results for input(s): LABPROT, INR in the last 72 hours. CMP     Component Value Date/Time   NA 135 08/16/2019 0847   K 3.9 08/16/2019 0847   CL 102 08/16/2019 0847   CO2 26 08/16/2019 0847   GLUCOSE 99 08/16/2019 0847   BUN 14 08/16/2019 0847   CREATININE 0.80 08/16/2019 0847   CALCIUM 9.2 08/16/2019 0847   PROT 7.2 08/14/2019 0533   ALBUMIN 3.3 (L) 08/14/2019 0533   AST 30 08/14/2019 0533   ALT 58 (H) 08/14/2019 0533   ALKPHOS 53 08/14/2019 0533   BILITOT 0.8 08/14/2019 0533   GFRNONAA >60 08/16/2019 0847   GFRAA >60 08/16/2019 0847   Lipase  No results found for: LIPASE     Studies/Results: No results found.  Anti-infectives: Anti-infectives (From admission, onward)   None       Assessment/Plan Incarcerated Hx schizophrenia  Ingestion of multiple foreign bodies Exploratory laparotomy with gastrotomy,gastrorrhaphy and removal of  foreign bodies(ball point pen and 3 spoons)08/12/2019, Dr. Harriette Bouillon  - POD #7 - remaining staples removed and wound packed wet to dry - recommend BID dressing changes - can reduce to once daily if wound is clean - recommend abdominal binder or restraints/mittens if unable to keep patient from messing with wound - patient does not require admission and recommend discharge to custody with follow up as planned 4/13 for wound check - no antibiotics needed at this time - instructions for wound care and follow up appointment in chart  LOS: 0 days    Norman Vasquez , [facility] 08/19/2019, 4:02 PM Please see Amion for pager number during day hours 7:00am-4:30pm

## 2019-08-19 NOTE — Discharge Instructions (Signed)
MIDLINE WOUND CARE: - midline dressing to be changed twice daily - supplies: sterile saline, gauze, ABD pads, tape  - remove dressing and all packing carefully, moistening with sterile saline as needed to avoid packing/internal dressing sticking to the wound. - clean edges of skin around the wound with water/gauze, making sure there is no tape debris or leakage left on skin that could cause skin irritation or breakdown. - dampen and clean gauze with sterile saline and pack wound from wound base to skin level, making sure to take note of any possible areas of wound tracking, tunneling and packing appropriately. Wound can be packed loosely. Trim kerlix to size if a whole kerlix is not required. - cover wound with a dry ABD pad and secure with tape.  - write the date/time on the dry dressing/tape to better track when the last dressing change occurred. - apply any skin protectant/powder recommended by clinician to protect skin/skin folds. - change dressing as needed if leakage occurs, wound gets contaminated, or patient requests to shower. - patient may shower daily with wound open and following the shower the wound should be dried and a clean dressing placed.

## 2020-03-30 ENCOUNTER — Encounter (HOSPITAL_COMMUNITY): Payer: Self-pay

## 2020-03-30 ENCOUNTER — Ambulatory Visit (HOSPITAL_COMMUNITY)
Admission: EM | Admit: 2020-03-30 | Discharge: 2020-03-31 | Disposition: A | Discharge status: Home / Self Care | Attending: Emergency Medicine | Admitting: Emergency Medicine

## 2020-03-30 ENCOUNTER — Other Ambulatory Visit: Payer: Self-pay

## 2020-03-30 ENCOUNTER — Emergency Department (HOSPITAL_COMMUNITY)

## 2020-03-30 DIAGNOSIS — T182XXA Foreign body in stomach, initial encounter: Secondary | ICD-10-CM | POA: Diagnosis present

## 2020-03-30 DIAGNOSIS — Z20822 Contact with and (suspected) exposure to covid-19: Secondary | ICD-10-CM | POA: Insufficient documentation

## 2020-03-30 DIAGNOSIS — T189XXA Foreign body of alimentary tract, part unspecified, initial encounter: Secondary | ICD-10-CM

## 2020-03-30 DIAGNOSIS — K3189 Other diseases of stomach and duodenum: Secondary | ICD-10-CM | POA: Diagnosis not present

## 2020-03-30 DIAGNOSIS — X58XXXA Exposure to other specified factors, initial encounter: Secondary | ICD-10-CM | POA: Diagnosis not present

## 2020-03-30 DIAGNOSIS — M795 Residual foreign body in soft tissue: Secondary | ICD-10-CM

## 2020-03-30 DIAGNOSIS — S51812A Laceration without foreign body of left forearm, initial encounter: Secondary | ICD-10-CM | POA: Insufficient documentation

## 2020-03-30 DIAGNOSIS — X788XXA Intentional self-harm by other sharp object, initial encounter: Secondary | ICD-10-CM | POA: Diagnosis not present

## 2020-03-30 LAB — RESPIRATORY PANEL BY RT PCR (FLU A&B, COVID)
Influenza A by PCR: NEGATIVE
Influenza B by PCR: NEGATIVE
SARS Coronavirus 2 by RT PCR: NEGATIVE

## 2020-03-30 MED ORDER — LACTATED RINGERS IV BOLUS
1000.0000 mL | Freq: Once | INTRAVENOUS | Status: AC
  Administered 2020-03-31: 1000 mL via INTRAVENOUS

## 2020-03-30 MED ORDER — LIDOCAINE-EPINEPHRINE 1 %-1:100000 IJ SOLN
10.0000 mL | Freq: Once | INTRAMUSCULAR | Status: AC
  Administered 2020-03-30: 10 mL
  Filled 2020-03-30: qty 1

## 2020-03-30 MED ORDER — MORPHINE SULFATE (PF) 4 MG/ML IV SOLN
4.0000 mg | Freq: Once | INTRAVENOUS | Status: AC
  Administered 2020-03-31: 4 mg via INTRAVENOUS
  Filled 2020-03-30: qty 1

## 2020-03-30 MED ORDER — ONDANSETRON HCL 4 MG/2ML IJ SOLN
4.0000 mg | Freq: Once | INTRAMUSCULAR | Status: DC
  Filled 2020-03-30: qty 2

## 2020-03-30 MED ORDER — BACITRACIN ZINC 500 UNIT/GM EX OINT
TOPICAL_OINTMENT | Freq: Two times a day (BID) | CUTANEOUS | Status: DC
  Administered 2020-03-31: 1 via TOPICAL
  Filled 2020-03-30: qty 0.9

## 2020-03-30 NOTE — ED Provider Notes (Signed)
[facility] EMERGENCY DEPARTMENT Provider Note   CSN: [id number] Arrival date & time: 03/30/20  2221     History No chief complaint on file.   Norman Vasquez is a 24 y.o. male.  Police witnessed patient ingested razor blade at about 9:40 PM.  Patient has a history of ingesting foreign bodies.  He reports he cut his left arm because the voices were telling him to do so, and then he swallowed the razor blade so that the police could not get it from him. He also alleges that he swallowed 3 toothbrushes about an hour prior to swallowing a razor blade.  He is complaining of epigastric abdominal pain.  The history is provided by the patient.  Foreign Body Location:  Swallowed Suspected object: razor blade. Pain quality:  Aching Pain severity:  Moderate Timing:  Constant Progression:  Unchanged Chronicity:  Recurrent Associated symptoms: abdominal pain and nausea   Associated symptoms: no cough, no ear pain, no sore throat and no vomiting   Abdominal pain:    Location:  Epigastric      Past Medical History:  Diagnosis Date   Schizophrenia [facility])     Patient Active Problem List   Diagnosis Date Noted   Foreign body in stomach 08/12/2019   Suicidal ideations 08/12/2019   Swallowed foreign body     Past Surgical History:  Procedure Laterality Date   ESOPHAGOGASTRODUODENOSCOPY N/A 08/12/2019   Procedure: ESOPHAGOGASTRODUODENOSCOPY (EGD);  Surgeon: Rachael Fee, MD;  Location: [facility];  Service: Endoscopy;  Laterality: N/A;   FOREIGN BODY REMOVAL N/A 08/12/2019   Procedure: FOREIGN BODY REMOVAL;  Surgeon: Rachael Fee, MD;  Location: [facility] ENDOSCOPY;  Service: Endoscopy;  Laterality: N/A;   INNER EAR SURGERY     LAPAROTOMY N/A 08/12/2019   Procedure: EXPLORATORY LAPAROTOMY , GASTRorrhaphy, REMOVAL OF FOREIGN BODIES;  Surgeon: Harriette Bouillon, MD;  Location: [facility] ORS;  Service: General;  Laterality: N/A;       Family History  Problem  Relation Age of Onset   Schizophrenia Neg Hx     Social History   Tobacco Use   Smoking status: Never Smoker   Smokeless tobacco: Never Used  Substance Use Topics   Alcohol use: Not Currently   Drug use: Not on file    Home Medications Prior to Admission medications   Medication Sig Start Date End Date Taking? Authorizing Provider  haloperidol (HALDOL) 5 MG tablet Take 1 tablet (5 mg total) by mouth at bedtime. 08/18/19 09/17/19  Briant Cedar, MD  oxyCODONE (OXY IR/ROXICODONE) 5 MG immediate release tablet Take 1-2 tablets (5-10 mg total) by mouth every 6 (six) hours as needed for moderate pain, severe pain or breakthrough pain. 08/18/19   Manus Rudd, MD    Allergies    Iodinated diagnostic agents, Amoxicillin, Penicillins, and Tylenol [acetaminophen]  Review of Systems   Review of Systems  Constitutional: Negative for chills and fever.  HENT: Negative for ear pain and sore throat.   Eyes: Negative for pain and visual disturbance.  Respiratory: Negative for cough and shortness of breath.   Cardiovascular: Negative for chest pain and palpitations.  Gastrointestinal: Positive for abdominal pain and nausea. Negative for vomiting.  Genitourinary: Negative for dysuria and hematuria.  Musculoskeletal: Negative for arthralgias and back pain.  Skin: Negative for color change and rash.  Neurological: Negative for seizures and syncope.  Psychiatric/Behavioral: Positive for hallucinations and self-injury.  All other systems reviewed and are negative.   Physical Exam Updated Vital  Signs BP (!) 131/105 (BP Location: Right Arm)    Pulse (!) 117    Temp 99 F (37.2 C) (Tympanic)    Resp 18    Ht 6\' 2"  (1.88 m)    Wt 72.6 kg    SpO2 100%    BMI 20.54 kg/m   Physical Exam Vitals and nursing note reviewed.  Constitutional:      Appearance: He is well-developed. He is not ill-appearing, toxic-appearing or diaphoretic.  HENT:     Head: Normocephalic and atraumatic.    Eyes:     Conjunctiva/sclera: Conjunctivae normal.     Pupils: Pupils are equal, round, and reactive to light.  Cardiovascular:     Rate and Rhythm: Regular rhythm. Tachycardia present.     Heart sounds: No murmur heard.  No gallop.   Pulmonary:     Effort: Pulmonary effort is normal. No respiratory distress.     Breath sounds: Normal breath sounds. No wheezing or rales.  Abdominal:     General: Bowel sounds are normal.     Palpations: Abdomen is soft.     Tenderness: There is abdominal tenderness in the epigastric area. There is no guarding or rebound.  Musculoskeletal:     Cervical back: Neck supple.  Skin:    General: Skin is warm and dry.     Findings: Laceration ( 5cm superficial straight hemostatic, distally with brisk cap refill, intact sensation, full ROM and strength in hand and wrist, 2+ radial and ulnar pulse) present.  Neurological:     General: No focal deficit present.     Mental Status: He is alert and oriented to person, place, and time.     ED Results / Procedures / Treatments   Labs (all labs ordered are listed, but only abnormal results are displayed) Labs Reviewed  RESPIRATORY PANEL BY RT PCR (FLU A&B, COVID)  CBC WITH DIFFERENTIAL/PLATELET    EKG None  Radiology DG Chest Port 1 View  Result Date: 03/30/2020 CLINICAL DATA:  Swallowed a razor blade EXAM: PORTABLE CHEST 1 VIEW COMPARISON:  08/11/2019 FINDINGS: Single frontal view of the chest demonstrates an unremarkable cardiac silhouette. No airspace disease, effusion, or pneumothorax. No free gas below the diaphragm. Partial visualization of an ingested metallic foreign body, please refer to the abdomen x-ray report. No acute bony abnormalities. IMPRESSION: 1. Ingested razor plate partially visualized over the gastric fundus. Please refer to the abdomen radiograph report. 2. Otherwise no acute intrathoracic process. Electronically Signed   By: 08/13/2019 M.D.   On: 03/30/2020 23:02   DG Abd Portable  1V  Result Date: 03/30/2020 CLINICAL DATA:  Swallowed a razor blade EXAM: PORTABLE ABDOMEN - 1 VIEW COMPARISON:  08/15/2019 FINDINGS: 2 supine frontal views of the abdomen and pelvis demonstrate 3.5 cm metallic foreign body in the region the gastric fundus consistent with ingested razor blade. No bowel obstruction or ileus. No evidence of free intra-abdominal gas. No acute bony abnormalities. IMPRESSION: 1. Metallic foreign body in the region the gastric fundus consistent with ingested razor blade. 2. No evidence of bowel perforation. Electronically Signed   By: 08/17/2019 M.D.   On: 03/30/2020 23:02    Procedures .13/01/2021Laceration Repair  Date/Time: 03/30/2020 11:11 PM Performed by: 13/05/2019, MD Authorized by: Loletha Carrow, MD   Consent:    Consent obtained:  Emergent situation and verbal   Consent given by:  Patient Anesthesia (see MAR for exact dosages):    Anesthesia method:  Local infiltration   Local anesthetic:  Lidocaine 1% WITH epi Laceration details:    Location:  Shoulder/arm   Shoulder/arm location:  L lower arm   Length (cm):  5 Repair type:    Repair type:  Simple Pre-procedure details:    Preparation:  Patient was prepped and draped in usual sterile fashion Exploration:    Hemostasis achieved with:  Epinephrine   Wound exploration: wound explored through full range of motion     Wound extent: no fascia violation noted, no foreign bodies/material noted, no muscle damage noted, no nerve damage noted, no tendon damage noted and no vascular damage noted     Contaminated: no   Treatment:    Area cleansed with:  Saline   Amount of cleaning:  Standard   Irrigation solution:  Sterile saline   Irrigation method:  Syringe Skin repair:    Repair method:  Sutures   Suture size:  3-0   Suture material:  Prolene   Suture technique:  Simple interrupted   Number of sutures:  6 Approximation:    Approximation:  Close   (including critical care time)  Medications  Ordered in ED Medications  morphine 4 MG/ML injection 4 mg (has no administration in time range)  ondansetron (ZOFRAN) injection 4 mg (has no administration in time range)  lactated ringers bolus 1,000 mL (has no administration in time range)  bacitracin ointment (has no administration in time range)  lidocaine-EPINEPHrine (XYLOCAINE W/EPI) 1 %-1:100000 (with pres) injection 10 mL (10 mLs Infiltration Given by Other 03/30/20 2325)    ED Course  I have reviewed the triage vital signs and the nursing notes.  Pertinent labs & imaging results that were available during my care of the patient were reviewed by me and considered in my medical decision making (see chart for details).    MDM Rules/Calculators/A&P                          The patient is a 24yo male, PMH schizophrenia, prior foreign body ingestions, who presents to the ED via EMS from jail for swallowed foreign body and laceration to left forearm.  On my initial evaluation, the patient is tachycardic but otherwise hemodynamically stable, afebrile, nontoxic-appearing. Physical exam remarkable for laceration of left forearm, epigastric tenderness to palpation.  IV fluid bolus, IV morphine, IV Zofran provided for pain.  Lac repaired as described above.  Patient made n.p.o. and preop Covid ordered.  X-rays of the chest and abdomen remarkable for foreign body in the stomach as interpreted by myself and radiology.  Consulted general surgery for evaluation of razor blade, who recommended evaluation by GI for endoscopic removal.  Transfer of care at 2330 to Dr. Myrtis Ser.  Plan of care communicated, which is pending general surgery recommendations for foreign body.  Patient stable condition at time of transfer.  The care of this patient was overseen by Dr. Rosalia Hammers, who agreed with evaluation and plan of care.   Final Clinical Impression(s) / ED Diagnoses Final diagnoses:  Swallowed foreign body, initial encounter  Laceration of left forearm,  initial encounter    Rx / DC Orders ED Discharge Orders    None       Loletha Carrow, MD 03/30/20 0109    Margarita Grizzle, MD 03/31/20 1318

## 2020-03-30 NOTE — ED Triage Notes (Signed)
Pt bib EMS from jail due to pt swallowing a razor blade after cutting a 2.5 inch wound into his left forearm down to the muscle. Pt has schizo-affective disorder and was out of Lamictal for about a week and starting hearing voices today that told him to cut himself or they would harm his mother. Pt is not having thoughts of harming others, only himself. Pt swallowed the razor blade and 3 toothbrushes at 2140.   Pt being treated for Hep C currently. Pt has 6/10 pain on arrival  Vitals stable BP: 120/84 HR:116 SPO2: 100

## 2020-03-30 NOTE — ED Provider Notes (Addendum)
Medical Decision Making: Care of patient assumed from Dr. Rosalia Hammers at 2300.  Agree with history, physical exam and plan.  See their note for further details.  Briefly, The pt p/w swallowing razor and toothbrush, also has arm lac.   Current plan is as follows: repair lac and imaging  Patient's Covid test is negative.  He remained stable, CBC pending, general surgery said this will be a case for GI via endoscopy.  I consulted GI, they agreed to evaluate the patient.  Patient remained stable and was taken for endoscopy.  The patient was sent back to the emergency department after his procedure, documentation notes say he needs to tolerate p.o. prior to discharge.  Nursing from PACU came with him, commented that he is done well he is tolerated p.o. with no difficulties, per their experience with endoscopy and removal he does not need any further testing from their standpoint any other observation that they would recommend.  The gastroenterologist is once he has tolerated p.o. he is safe to go.  This is happened in the patient's discharge  I personally reviewed and interpreted all labs/imaging.      Sabino Donovan, MD 03/31/20 0154    Sabino Donovan, MD 03/31/20 [phone]

## 2020-03-31 ENCOUNTER — Emergency Department (HOSPITAL_COMMUNITY)
Admission: EM | Admit: 2020-03-31 | Discharge: 2020-03-31 | Disposition: A | Discharge status: Home / Self Care | Source: Home / Self Care | Attending: Emergency Medicine | Admitting: Emergency Medicine

## 2020-03-31 ENCOUNTER — Encounter (HOSPITAL_COMMUNITY): Payer: Self-pay

## 2020-03-31 ENCOUNTER — Other Ambulatory Visit: Payer: Self-pay

## 2020-03-31 ENCOUNTER — Encounter (HOSPITAL_COMMUNITY)
Admission: EM | Disposition: A | Discharge status: Home / Self Care | Payer: Self-pay | Source: Home / Self Care | Attending: Emergency Medicine

## 2020-03-31 ENCOUNTER — Emergency Department (HOSPITAL_COMMUNITY): Admitting: Anesthesiology

## 2020-03-31 DIAGNOSIS — S51812D Laceration without foreign body of left forearm, subsequent encounter: Secondary | ICD-10-CM | POA: Insufficient documentation

## 2020-03-31 DIAGNOSIS — X58XXXD Exposure to other specified factors, subsequent encounter: Secondary | ICD-10-CM | POA: Insufficient documentation

## 2020-03-31 HISTORY — PX: FOREIGN BODY REMOVAL: SHX962

## 2020-03-31 HISTORY — PX: ESOPHAGOGASTRODUODENOSCOPY: SHX5428

## 2020-03-31 LAB — BASIC METABOLIC PANEL
Anion gap: 7 (ref 5–15)
BUN: 8 mg/dL (ref 6–20)
CO2: 25 mmol/L (ref 22–32)
Calcium: 8.6 mg/dL — ABNORMAL LOW (ref 8.9–10.3)
Chloride: 107 mmol/L (ref 98–111)
Creatinine, Ser: 0.84 mg/dL (ref 0.61–1.24)
GFR, Estimated: >60 mL/min (ref >60)
Glucose, Bld: 84 mg/dL (ref 70–99)
Potassium: 4.9 mmol/L (ref 3.5–5.1)
Sodium: 139 mmol/L (ref 135–145)

## 2020-03-31 LAB — CBC WITH DIFFERENTIAL/PLATELET
Abs Immature Granulocytes: 0.03 10*3/uL (ref 0.00–0.07)
Basophils Absolute: 0 10*3/uL (ref 0.0–0.1)
Basophils Relative: 0 %
Eosinophils Absolute: 0.2 10*3/uL (ref 0.0–0.5)
Eosinophils Relative: 2 %
HCT: 44 % (ref 39.0–52.0)
Hemoglobin: 14.6 g/dL (ref 13.0–17.0)
Immature Granulocytes: 0 %
Lymphocytes Relative: 22 %
Lymphs Abs: 2.3 10*3/uL (ref 0.7–4.0)
MCH: 30.4 pg (ref 26.0–34.0)
MCHC: 33.2 g/dL (ref 30.0–36.0)
MCV: 91.5 fL (ref 80.0–100.0)
Monocytes Absolute: 0.8 10*3/uL (ref 0.1–1.0)
Monocytes Relative: 8 %
Neutro Abs: 6.8 10*3/uL (ref 1.7–7.7)
Neutrophils Relative %: 68 %
Platelets: 206 10*3/uL (ref 150–400)
RBC: 4.81 MIL/uL (ref 4.22–5.81)
RDW: 12.8 % (ref 11.5–15.5)
WBC: 10.1 10*3/uL (ref 4.0–10.5)
nRBC: 0 % (ref 0.0–0.2)

## 2020-03-31 SURGERY — EGD (ESOPHAGOGASTRODUODENOSCOPY)
Anesthesia: General

## 2020-03-31 MED ORDER — FENTANYL CITRATE (PF) 250 MCG/5ML IJ SOLN
INTRAMUSCULAR | Status: DC | PRN
Start: 2020-03-31 — End: 2020-03-31
  Administered 2020-03-31 (×2): 50 ug via INTRAVENOUS

## 2020-03-31 MED ORDER — PHENYLEPHRINE HCL (PRESSORS) 10 MG/ML IV SOLN
INTRAVENOUS | Status: DC | PRN
  Administered 2020-03-31: 80 ug via INTRAVENOUS

## 2020-03-31 MED ORDER — LACTATED RINGERS IV SOLN: INTRAVENOUS | Status: DC | PRN

## 2020-03-31 MED ORDER — LIDOCAINE HCL (CARDIAC) PF 100 MG/5ML IV SOSY
PREFILLED_SYRINGE | INTRAVENOUS | Status: DC | PRN
  Administered 2020-03-31: 40 mg via INTRATRACHEAL

## 2020-03-31 MED ORDER — PROPOFOL 10 MG/ML IV BOLUS
INTRAVENOUS | Status: DC | PRN
  Administered 2020-03-31: 170 mg via INTRAVENOUS

## 2020-03-31 MED ORDER — EPHEDRINE SULFATE 50 MG/ML IJ SOLN
INTRAMUSCULAR | Status: DC | PRN
  Administered 2020-03-31: 5 mg via INTRAVENOUS

## 2020-03-31 MED ORDER — MIDAZOLAM HCL 5 MG/5ML IJ SOLN
INTRAMUSCULAR | Status: DC | PRN
  Administered 2020-03-31: 1 mg via INTRAVENOUS

## 2020-03-31 MED ORDER — FENTANYL CITRATE (PF) 250 MCG/5ML IJ SOLN
INTRAMUSCULAR | Status: AC
  Filled 2020-03-31: qty 5

## 2020-03-31 MED ORDER — SUCCINYLCHOLINE CHLORIDE 20 MG/ML IJ SOLN
INTRAMUSCULAR | Status: DC | PRN
  Administered 2020-03-31: 120 mg via INTRAVENOUS

## 2020-03-31 MED ORDER — PHENYLEPHRINE 40 MCG/ML (10ML) SYRINGE FOR IV PUSH (FOR BLOOD PRESSURE SUPPORT)
PREFILLED_SYRINGE | INTRAVENOUS | Status: AC
  Filled 2020-03-31: qty 30

## 2020-03-31 MED ORDER — MIDAZOLAM HCL 2 MG/2ML IJ SOLN
INTRAMUSCULAR | Status: AC
  Filled 2020-03-31: qty 2

## 2020-03-31 MED ORDER — ROCURONIUM BROMIDE 10 MG/ML (PF) SYRINGE
PREFILLED_SYRINGE | INTRAVENOUS | Status: AC
  Filled 2020-03-31: qty 10

## 2020-03-31 NOTE — ED Provider Notes (Signed)
[facility] EMERGENCY DEPARTMENT Provider Note   CSN: [id number] Arrival date & time: 03/31/20  1705     History Chief Complaint  Patient presents with  . Arm Pain    Jaydrian Corpening is a 24 y.o. male.  Patient with hx schizophrenia and self-mutilation behavior/fb ingestion, presents via law enforcement, as patient had sutures placed yesterday for left forearm laceration, and he removed the sutures today. No other new wounds or new injury. No new ingestion. Patient denies pain or other acute/new complaint today, other than wound being open after sutures removed. No active bleeding. Tetanus up to date.   The history is provided by the patient and the police.  Arm Pain Pertinent negatives include no chest pain, no abdominal pain and no shortness of breath.       Past Medical History:  Diagnosis Date  . Schizophrenia [facility])     Patient Active Problem List   Diagnosis Date Noted  . Foreign body in stomach 08/12/2019  . Suicidal ideations 08/12/2019  . Swallowed foreign body     Past Surgical History:  Procedure Laterality Date  . ESOPHAGOGASTRODUODENOSCOPY N/A 08/12/2019   Procedure: ESOPHAGOGASTRODUODENOSCOPY (EGD);  Surgeon: Rachael Fee, MD;  Location: [facility];  Service: Endoscopy;  Laterality: N/A;  . FOREIGN BODY REMOVAL N/A 08/12/2019   Procedure: FOREIGN BODY REMOVAL;  Surgeon: Rachael Fee, MD;  Location: [facility] ENDOSCOPY;  Service: Endoscopy;  Laterality: N/A;  . INNER EAR SURGERY    . LAPAROTOMY N/A 08/12/2019   Procedure: EXPLORATORY LAPAROTOMY , GASTRorrhaphy, REMOVAL OF FOREIGN BODIES;  Surgeon: Harriette Bouillon, MD;  Location: [facility] ORS;  Service: General;  Laterality: N/A;       Family History  Problem Relation Age of Onset  . Schizophrenia Neg Hx     Social History   Tobacco Use  . Smoking status: Never Smoker  . Smokeless tobacco: Never Used  Substance Use Topics  . Alcohol use: Not Currently  . Drug use: Not on file     Home Medications Prior to Admission medications   Medication Sig Start Date End Date Taking? Authorizing Provider  haloperidol (HALDOL) 5 MG tablet Take 1 tablet (5 mg total) by mouth at bedtime. 08/18/19 09/17/19  Briant Cedar, MD  oxyCODONE (OXY IR/ROXICODONE) 5 MG immediate release tablet Take 1-2 tablets (5-10 mg total) by mouth every 6 (six) hours as needed for moderate pain, severe pain or breakthrough pain. 08/18/19   Manus Rudd, MD    Allergies    Iodinated diagnostic agents, Amoxicillin, Penicillins, and Tylenol [acetaminophen]  Review of Systems   Review of Systems  Constitutional: Negative for fever.  Respiratory: Negative for shortness of breath.   Cardiovascular: Negative for chest pain.  Gastrointestinal: Negative for abdominal pain and vomiting.  Skin: Positive for wound.  Hematological: Does not bruise/bleed easily.    Physical Exam Updated Vital Signs BP 129/78 (BP Location: Right Arm)   Pulse (!) 105   Resp 16   SpO2 98%   Physical Exam Vitals and nursing note reviewed.  Constitutional:      Appearance: Normal appearance. He is well-developed.  HENT:     Head: Atraumatic.     Nose: Nose normal.     Mouth/Throat:     Mouth: Mucous membranes are moist.  Eyes:     General: No scleral icterus.    Conjunctiva/sclera: Conjunctivae normal.  Neck:     Trachea: No tracheal deviation.  Cardiovascular:     Rate and Rhythm: Normal  rate.     Pulses: Normal pulses.  Pulmonary:     Effort: Pulmonary effort is normal. No accessory muscle usage or respiratory distress.  Genitourinary:    Comments: No cva tenderness. Musculoskeletal:        General: No swelling.     Cervical back: Neck supple.     Comments: Open laceration to left forearm, without bleeding or acute infection. Radial pulse palp.   Skin:    General: Skin is warm and dry.     Findings: No rash.  Neurological:     Mental Status: He is alert.     Comments: Alert, speech clear.    Psychiatric:        Mood and Affect: Mood normal.     ED Results / Procedures / Treatments   Labs (all labs ordered are listed, but only abnormal results are displayed) Labs Reviewed - No data to display  EKG None  Radiology DG Chest [facility] 1 View  Result Date: 03/30/2020 CLINICAL DATA:  Swallowed a razor blade EXAM: PORTABLE CHEST 1 VIEW COMPARISON:  08/11/2019 FINDINGS: Single frontal view of the chest demonstrates an unremarkable cardiac silhouette. No airspace disease, effusion, or pneumothorax. No free gas below the diaphragm. Partial visualization of an ingested metallic foreign body, please refer to the abdomen x-ray report. No acute bony abnormalities. IMPRESSION: 1. Ingested razor plate partially visualized over the gastric fundus. Please refer to the abdomen radiograph report. 2. Otherwise no acute intrathoracic process. Electronically Signed   By: Sharlet Salina M.D.   On: 03/30/2020 23:02   DG Abd Portable 1V  Result Date: 03/30/2020 CLINICAL DATA:  Swallowed a razor blade EXAM: PORTABLE ABDOMEN - 1 VIEW COMPARISON:  08/15/2019 FINDINGS: 2 supine frontal views of the abdomen and pelvis demonstrate 3.5 cm metallic foreign body in the region the gastric fundus consistent with ingested razor blade. No bowel obstruction or ileus. No evidence of free intra-abdominal gas. No acute bony abnormalities. IMPRESSION: 1. Metallic foreign body in the region the gastric fundus consistent with ingested razor blade. 2. No evidence of bowel perforation. Electronically Signed   By: Sharlet Salina M.D.   On: 03/30/2020 23:02    Procedures Procedures (including critical care time)  Medications Ordered in ED Medications - No data to display  ED Course  I have reviewed the triage vital signs and the nursing notes.  Pertinent labs & imaging results that were available during my care of the patient were reviewed by me and considered in my medical decision making (see chart for details).    MDM  Rules/Calculators/A&P                         Discussed given length of time since initial injury, would not recommend repeat wound closure now due to risk of infection.   Wound cleaned, sterile dressing.   Reviewed nursing notes and prior charts for additional history.   Currently hr 82, rr 15. Patient appears stable for d/c.    Final Clinical Impression(s) / ED Diagnoses Final diagnoses:  None    Rx / DC Orders ED Discharge Orders    None       Cathren Laine, MD 03/31/20 2138

## 2020-03-31 NOTE — ED Notes (Signed)
Pt removed stiches to left arm, wounds bleeding controlled, to cleaned with peroxide and bandaged at this time.

## 2020-03-31 NOTE — Anesthesia Postprocedure Evaluation (Signed)
Anesthesia Post Note  Patient: Norman Vasquez  Procedure(s) Performed: ESOPHAGOGASTRODUODENOSCOPY (EGD) (N/A ) FOREIGN BODY REMOVAL     Patient location during evaluation: PACU Anesthesia Type: General Level of consciousness: awake and alert Pain management: pain level controlled Vital Signs Assessment: post-procedure vital signs reviewed and stable Respiratory status: spontaneous breathing, nonlabored ventilation and respiratory function stable Cardiovascular status: blood pressure returned to baseline and stable Postop Assessment: no apparent nausea or vomiting Anesthetic complications: no   No complications documented.  Last Vitals:  Vitals:   03/31/20 0329 03/31/20 0330  BP:  (!) 137/102  Pulse: 96 (!) 101  Resp: 13 14  Temp: (!) 36.3 C   SpO2: 100% 100%    Last Pain:  Vitals:   03/31/20 0305  TempSrc:   PainSc: 0-No pain                 Koki Buxton,W. EDMOND

## 2020-03-31 NOTE — Consult Note (Signed)
Referring Provider: Dr. Katz Primary Care Physician:  Patient, No Pcp Per Primary Gastroenterologist:  UNASSIGNED  Reason for Consultation:  Foreign body ingestion  HPI: Norman Vasquez is a 24 y.o. male with history of foreign body ingestion who reports hearing voices tonight and swallowed one razor blade and 3 small toothbrushes. He had an exploratory laparotomy in March following failed removal of foreign bodies by Dr. Jacobs. Complaining of abdominal pain. No vomiting. KUB shows a foreign body thought to be a razor blade in the fundus of the stomach.  Past Medical History:  Diagnosis Date  . Schizophrenia (HCC)     Past Surgical History:  Procedure Laterality Date  . ESOPHAGOGASTRODUODENOSCOPY N/A 08/12/2019   Procedure: ESOPHAGOGASTRODUODENOSCOPY (EGD);  Surgeon: Jacobs, Daniel P, MD;  Location: [facility];  Service: Endoscopy;  Laterality: N/A;  . FOREIGN BODY REMOVAL N/A 08/12/2019   Procedure: FOREIGN BODY REMOVAL;  Surgeon: Jacobs, Daniel P, MD;  Location: [facility];  Service: Endoscopy;  Laterality: N/A;  . INNER EAR SURGERY    . LAPAROTOMY N/A 08/12/2019   Procedure: EXPLORATORY LAPAROTOMY , GASTRorrhaphy, REMOVAL OF FOREIGN BODIES;  Surgeon: Cornett, Thomas, MD;  Location: [facility];  Service: General;  Laterality: N/A;    Prior to Admission medications   Medication Sig Start Date End Date Taking? Authorizing Provider  haloperidol (HALDOL) 5 MG tablet Take 1 tablet (5 mg total) by mouth at bedtime. 08/18/19 09/17/19  Ezenduka, Nkeiruka J, MD  oxyCODONE (OXY IR/ROXICODONE) 5 MG immediate release tablet Take 1-2 tablets (5-10 mg total) by mouth every 6 (six) hours as needed for moderate pain, severe pain or breakthrough pain. 08/18/19   Tsuei, Matthew, MD    Scheduled Meds: . [MAR Hold] bacitracin   Topical BID  . [MAR Hold] ondansetron (ZOFRAN) IV  4 mg Intravenous Once   Continuous Infusions: PRN Meds:.  Allergies as of 03/30/2020 - Review Complete 03/30/2020   Allergen Reaction Noted  . Iodinated diagnostic agents Itching and Rash 08/12/2019  . Amoxicillin  08/11/2019  . Penicillins  08/11/2019  . Tylenol [acetaminophen]  08/11/2019    Family History  Problem Relation Age of Onset  . Schizophrenia Neg Hx     Social History   Socioeconomic History  . Marital status: Single    Spouse name: Not on file  . Number of children: Not on file  . Years of education: Not on file  . Highest education level: Not on file  Occupational History  . Not on file  Tobacco Use  . Smoking status: Never Smoker  . Smokeless tobacco: Never Used  Substance and Sexual Activity  . Alcohol use: Not Currently  . Drug use: Not on file  . Sexual activity: Not on file  Other Topics Concern  . Not on file  Social History Narrative  . Not on file   Social Determinants of Health   Financial Resource Strain:   . Difficulty of Paying Living Expenses: Not on file  Food Insecurity:   . Worried About Running Out of Food in the Last Year: Not on file  . Ran Out of Food in the Last Year: Not on file  Transportation Needs:   . Lack of Transportation (Medical): Not on file  . Lack of Transportation (Non-Medical): Not on file  Physical Activity:   . Days of Exercise per Week: Not on file  . Minutes of Exercise per Session: Not on file  Stress:   . Feeling of Stress : Not on file  Social Connections:   .   Frequency of Communication with Friends and Family: Not on file  . Frequency of Social Gatherings with Friends and Family: Not on file  . Attends Religious Services: Not on file  . Active Member of Clubs or Organizations: Not on file  . Attends Banker Meetings: Not on file  . Marital Status: Not on file  Intimate Partner Violence:   . Fear of Current or Ex-Partner: Not on file  . Emotionally Abused: Not on file  . Physically Abused: Not on file  . Sexually Abused: Not on file    Review of Systems: All negative except as stated above in  HPI.  Physical Exam: Vital signs: Vitals:   03/30/20 2300 03/31/20 0109  BP: (!) 135/92 (!) 156/112  Pulse: (!) 106 99  Resp:  14  Temp:  98.3 F (36.8 C)  SpO2: 100% 100%     General:   Lethargic, Well-developed, well-nourished, no acute distress  Head: normocephalic, atraumatic Eyes: anicteric sclera ENT: oropharynx clear Neck: supple, nontender Lungs:  Clear throughout to auscultation.   No wheezes, crackles, or rhonchi. No acute distress. Heart:  Regular rate and rhythm; no murmurs, clicks, rubs,  or gallops. Abdomen: epigastric tenderness with guarding, soft, nondistended, +BS  Rectal:  Deferred Ext: no edema  GI:  Lab Results: Recent Labs    03/30/20 2334  WBC 10.1  HGB 14.6  HCT 44.0  PLT 206   BMET Recent Labs    03/30/20 2357  NA 139  K 4.9  CL 107  CO2 25  GLUCOSE 84  BUN 8  CREATININE 0.84  CALCIUM 8.6*   LFT No results for input(s): PROT, ALBUMIN, AST, ALT, ALKPHOS, BILITOT, BILIDIR, IBILI in the last 72 hours. PT/INR No results for input(s): LABPROT, INR in the last 72 hours.   Studies/Results: DG Chest Port 1 View  Result Date: 03/30/2020 CLINICAL DATA:  Swallowed a razor blade EXAM: PORTABLE CHEST 1 VIEW COMPARISON:  08/11/2019 FINDINGS: Single frontal view of the chest demonstrates an unremarkable cardiac silhouette. No airspace disease, effusion, or pneumothorax. No free gas below the diaphragm. Partial visualization of an ingested metallic foreign body, please refer to the abdomen x-ray report. No acute bony abnormalities. IMPRESSION: 1. Ingested razor plate partially visualized over the gastric fundus. Please refer to the abdomen radiograph report. 2. Otherwise no acute intrathoracic process. Electronically Signed   By: Sharlet Salina M.D.   On: 03/30/2020 23:02   DG Abd Portable 1V  Result Date: 03/30/2020 CLINICAL DATA:  Swallowed a razor blade EXAM: PORTABLE ABDOMEN - 1 VIEW COMPARISON:  08/15/2019 FINDINGS: 2 supine frontal views of  the abdomen and pelvis demonstrate 3.5 cm metallic foreign body in the region the gastric fundus consistent with ingested razor blade. No bowel obstruction or ileus. No evidence of free intra-abdominal gas. No acute bony abnormalities. IMPRESSION: 1. Metallic foreign body in the region the gastric fundus consistent with ingested razor blade. 2. No evidence of bowel perforation. Electronically Signed   By: Sharlet Salina M.D.   On: 03/30/2020 23:02    Impression/Plan: Foreign body ingestion in need of an urgent EGD for attempted removal. If unsuccessful, then he will need surgery for removal. Informed consent obtained.    LOS: 0 days   Shirley Friar  03/31/2020, 1:39 AM  Questions please call [phone]

## 2020-03-31 NOTE — ED Triage Notes (Signed)
Pt BIB [facility], custody of [facility] de[facility]. Pt received stitches to his Left forearm this am at our ED. Pt tried to bite his stitches out. Bleeding controlled, bandage in place, pt NAD  VSS  BP 131/77 HR 104 RR 18 98% RA

## 2020-03-31 NOTE — Transfer of Care (Signed)
Immediate Anesthesia Transfer of Care Note  Patient: Colden Wish  Procedure(s) Performed: ESOPHAGOGASTRODUODENOSCOPY (EGD) (N/A ) FOREIGN BODY REMOVAL  Patient Location: PACU  Anesthesia Type:General  Level of Consciousness: awake  Airway & Oxygen Therapy: Patient Spontanous Breathing  Post-op Assessment: Report given to RN and Post -op Vital signs reviewed and stable  Post vital signs: Reviewed and stable  Last Vitals:  Vitals Value Taken Time  BP    Temp    Pulse 106 03/31/20 0306  Resp 15 03/31/20 0306  SpO2 100 % 03/31/20 0306  Vitals shown include unvalidated device data.  Last Pain:  Vitals:   03/31/20 0109  TempSrc: Oral  PainSc: 4          Complications: No complications documented.

## 2020-03-31 NOTE — ED Notes (Addendum)
Pt arrived from PACU following endoscopy. Pt tolerated PO fluids while in PACU. A&Ox4.

## 2020-03-31 NOTE — Discharge Instructions (Addendum)
Sutures to be removed in one week

## 2020-03-31 NOTE — H&P (View-Only) (Signed)
Referring Provider: Dr. Myrtis Ser Primary Care Physician:  Patient, No Pcp Per Primary Gastroenterologist:  UNASSIGNED  Reason for Consultation:  Foreign body ingestion  HPI: Norman Vasquez is a 24 y.o. male with history of foreign body ingestion who reports hearing voices tonight and swallowed one razor blade and 3 small toothbrushes. He had an exploratory laparotomy in March following failed removal of foreign bodies by Dr. Christella Hartigan. Complaining of abdominal pain. No vomiting. KUB shows a foreign body thought to be a razor blade in the fundus of the stomach.  Past Medical History:  Diagnosis Date  . Schizophrenia [facility])     Past Surgical History:  Procedure Laterality Date  . ESOPHAGOGASTRODUODENOSCOPY N/A 08/12/2019   Procedure: ESOPHAGOGASTRODUODENOSCOPY (EGD);  Surgeon: Rachael Fee, MD;  Location: [facility];  Service: Endoscopy;  Laterality: N/A;  . FOREIGN BODY REMOVAL N/A 08/12/2019   Procedure: FOREIGN BODY REMOVAL;  Surgeon: Rachael Fee, MD;  Location: [facility];  Service: Endoscopy;  Laterality: N/A;  . INNER EAR SURGERY    . LAPAROTOMY N/A 08/12/2019   Procedure: EXPLORATORY LAPAROTOMY , GASTRorrhaphy, REMOVAL OF FOREIGN BODIES;  Surgeon: Harriette Bouillon, MD;  Location: [facility];  Service: General;  Laterality: N/A;    Prior to Admission medications   Medication Sig Start Date End Date Taking? Authorizing Provider  haloperidol (HALDOL) 5 MG tablet Take 1 tablet (5 mg total) by mouth at bedtime. 08/18/19 09/17/19  Briant Cedar, MD  oxyCODONE (OXY IR/ROXICODONE) 5 MG immediate release tablet Take 1-2 tablets (5-10 mg total) by mouth every 6 (six) hours as needed for moderate pain, severe pain or breakthrough pain. 08/18/19   Manus Rudd, MD    Scheduled Meds: . [MAR Hold] bacitracin   Topical BID  . [MAR Hold] ondansetron (ZOFRAN) IV  4 mg Intravenous Once   Continuous Infusions: PRN Meds:.  Allergies as of 03/30/2020 - Review Complete 03/30/2020   Allergen Reaction Noted  . Iodinated diagnostic agents Itching and Rash 08/12/2019  . Amoxicillin  08/11/2019  . Penicillins  08/11/2019  . Tylenol [acetaminophen]  08/11/2019    Family History  Problem Relation Age of Onset  . Schizophrenia Neg Hx     Social History   Socioeconomic History  . Marital status: Single    Spouse name: Not on file  . Number of children: Not on file  . Years of education: Not on file  . Highest education level: Not on file  Occupational History  . Not on file  Tobacco Use  . Smoking status: Never Smoker  . Smokeless tobacco: Never Used  Substance and Sexual Activity  . Alcohol use: Not Currently  . Drug use: Not on file  . Sexual activity: Not on file  Other Topics Concern  . Not on file  Social History Narrative  . Not on file   Social Determinants of Health   Financial Resource Strain:   . Difficulty of Paying Living Expenses: Not on file  Food Insecurity:   . Worried About Programme researcher, broadcasting/film/video in the Last Year: Not on file  . Ran Out of Food in the Last Year: Not on file  Transportation Needs:   . Lack of Transportation (Medical): Not on file  . Lack of Transportation (Non-Medical): Not on file  Physical Activity:   . Days of Exercise per Week: Not on file  . Minutes of Exercise per Session: Not on file  Stress:   . Feeling of Stress : Not on file  Social Connections:   .  Frequency of Communication with Friends and Family: Not on file  . Frequency of Social Gatherings with Friends and Family: Not on file  . Attends Religious Services: Not on file  . Active Member of Clubs or Organizations: Not on file  . Attends Banker Meetings: Not on file  . Marital Status: Not on file  Intimate Partner Violence:   . Fear of Current or Ex-Partner: Not on file  . Emotionally Abused: Not on file  . Physically Abused: Not on file  . Sexually Abused: Not on file    Review of Systems: All negative except as stated above in  HPI.  Physical Exam: Vital signs: Vitals:   03/30/20 2300 03/31/20 0109  BP: (!) 135/92 (!) 156/112  Pulse: (!) 106 99  Resp:  14  Temp:  98.3 F (36.8 C)  SpO2: 100% 100%     General:   Lethargic, Well-developed, well-nourished, no acute distress  Head: normocephalic, atraumatic Eyes: anicteric sclera ENT: oropharynx clear Neck: supple, nontender Lungs:  Clear throughout to auscultation.   No wheezes, crackles, or rhonchi. No acute distress. Heart:  Regular rate and rhythm; no murmurs, clicks, rubs,  or gallops. Abdomen: epigastric tenderness with guarding, soft, nondistended, +BS  Rectal:  Deferred Ext: no edema  GI:  Lab Results: Recent Labs    03/30/20 2334  WBC 10.1  HGB 14.6  HCT 44.0  PLT 206   BMET Recent Labs    03/30/20 2357  NA 139  K 4.9  CL 107  CO2 25  GLUCOSE 84  BUN 8  CREATININE 0.84  CALCIUM 8.6*   LFT No results for input(s): PROT, ALBUMIN, AST, ALT, ALKPHOS, BILITOT, BILIDIR, IBILI in the last 72 hours. PT/INR No results for input(s): LABPROT, INR in the last 72 hours.   Studies/Results: DG Chest Port 1 View  Result Date: 03/30/2020 CLINICAL DATA:  Swallowed a razor blade EXAM: PORTABLE CHEST 1 VIEW COMPARISON:  08/11/2019 FINDINGS: Single frontal view of the chest demonstrates an unremarkable cardiac silhouette. No airspace disease, effusion, or pneumothorax. No free gas below the diaphragm. Partial visualization of an ingested metallic foreign body, please refer to the abdomen x-ray report. No acute bony abnormalities. IMPRESSION: 1. Ingested razor plate partially visualized over the gastric fundus. Please refer to the abdomen radiograph report. 2. Otherwise no acute intrathoracic process. Electronically Signed   By: Sharlet Salina M.D.   On: 03/30/2020 23:02   DG Abd Portable 1V  Result Date: 03/30/2020 CLINICAL DATA:  Swallowed a razor blade EXAM: PORTABLE ABDOMEN - 1 VIEW COMPARISON:  08/15/2019 FINDINGS: 2 supine frontal views of  the abdomen and pelvis demonstrate 3.5 cm metallic foreign body in the region the gastric fundus consistent with ingested razor blade. No bowel obstruction or ileus. No evidence of free intra-abdominal gas. No acute bony abnormalities. IMPRESSION: 1. Metallic foreign body in the region the gastric fundus consistent with ingested razor blade. 2. No evidence of bowel perforation. Electronically Signed   By: Sharlet Salina M.D.   On: 03/30/2020 23:02    Impression/Plan: Foreign body ingestion in need of an urgent EGD for attempted removal. If unsuccessful, then he will need surgery for removal. Informed consent obtained.    LOS: 0 days   Shirley Friar  03/31/2020, 1:39 AM  Questions please call [phone]

## 2020-03-31 NOTE — Anesthesia Procedure Notes (Signed)
Procedure Name: Intubation Date/Time: 03/31/2020 1:55 AM Performed by: Claudina Lick, CRNA Pre-anesthesia Checklist: Patient identified, Emergency Drugs available, Suction available, Patient being monitored and Timeout performed Patient Re-evaluated:Patient Re-evaluated prior to induction Oxygen Delivery Method: Circle system utilized Preoxygenation: Pre-oxygenation with 100% oxygen Induction Type: IV induction, Rapid sequence and Cricoid Pressure applied Laryngoscope Size: Miller and 2 Grade View: Grade I Tube type: Oral Tube size: 7.5 mm Number of attempts: 1 Airway Equipment and Method: Stylet Placement Confirmation: ETT inserted through vocal cords under direct vision,  positive ETCO2 and breath sounds checked- equal and bilateral Secured at: 23 cm Tube secured with: Tape Dental Injury: Teeth and Oropharynx as per pre-operative assessment

## 2020-03-31 NOTE — Progress Notes (Signed)
Dr. Bosie Clos removed 3 toothbrushes and 1 razorblade from patients stomach. Patient transported to PACU and report given to PACU RN's. Officers at bedside.

## 2020-03-31 NOTE — Anesthesia Preprocedure Evaluation (Addendum)
Anesthesia Evaluation  Patient identified by MRN, date of birth, ID band Patient awake    Reviewed: Allergy & Precautions, H&P , NPO status , Patient's Chart, lab work & pertinent test results  Airway Mallampati: II  TM Distance: >3 FB Neck ROM: Full    Dental no notable dental hx. (+) Teeth Intact, Dental Advisory Given   Pulmonary neg pulmonary ROS,    Pulmonary exam normal breath sounds clear to auscultation       Cardiovascular negative cardio ROS   Rhythm:Regular Rate:Normal     Neuro/Psych Schizophrenia negative neurological ROS     GI/Hepatic negative GI ROS, Neg liver ROS,   Endo/Other  negative endocrine ROS  Renal/GU negative Renal ROS  negative genitourinary   Musculoskeletal   Abdominal   Peds  Hematology negative hematology ROS (+)   Anesthesia Other Findings   Reproductive/Obstetrics negative OB ROS                            Anesthesia Physical Anesthesia Plan  ASA: II and emergent  Anesthesia Plan: General   Post-op Pain Management:    Induction: Intravenous, Rapid sequence and Cricoid pressure planned  PONV Risk Score and Plan: 2 and Ondansetron, Dexamethasone and Midazolam  Airway Management Planned: Oral ETT  Additional Equipment:   Intra-op Plan:   Post-operative Plan: Extubation in OR  Informed Consent: I have reviewed the patients History and Physical, chart, labs and discussed the procedure including the risks, benefits and alternatives for the proposed anesthesia with the patient or authorized representative who has indicated his/her understanding and acceptance.     Dental advisory given  Plan Discussed with: CRNA  Anesthesia Plan Comments:         Anesthesia Quick Evaluation

## 2020-03-31 NOTE — Op Note (Signed)
[facility] Patient Name: Darin Arndt Procedure Date : 03/31/2020 MRN: [id number] Attending MD: Shirley Friar , MD Date of Birth: 1996/02/02 CSN: [id number] Age: 24 Admit Type: Inpatient Procedure:                Upper GI endoscopy Indications:              Foreign body in the stomach Providers:                Shirley Friar, MD, Adolph Pollack, RN,                            Leanne Lovely, Technician, Linde Gillis, CRNA Referring MD:             ER (Dr. Myrtis Ser) Medicines:                Monitored Anesthesia Care, See the Anesthesia note                            for documentation of the administered medications Complications:            No immediate complications. Estimated Blood Loss:     Estimated blood loss was minimal. Procedure:                Pre-Anesthesia Assessment:                           - Prior to the procedure, a History and Physical                            was performed, and patient medications and                            allergies were reviewed. The patient's tolerance of                            previous anesthesia was also reviewed. The risks                            and benefits of the procedure and the sedation                            options and risks were discussed with the patient.                            All questions were answered, and informed consent                            was obtained. Prior Anticoagulants: The patient has                            taken no previous anticoagulant or antiplatelet                            agents. ASA Grade Assessment: III - A patient with  severe systemic disease. After reviewing the risks                            and benefits, the patient was deemed in                            satisfactory condition to undergo the procedure.                           After obtaining informed consent, the endoscope was                             passed under direct vision. Throughout the                            procedure, the patient's blood pressure, pulse, and                            oxygen saturations were monitored continuously. The                            GIF-H190 ([id number]) Olympus gastroscope was                            introduced through the mouth, and advanced to the                            second part of duodenum. The upper GI endoscopy was                            extremely difficult due to presence of foreign                            body. Successful completion of the procedure was                            aided by straightening and shortening the scope to                            obtain bowel loop reduction and performing the                            maneuvers documented (below) in this report. The                            patient tolerated the procedure well. Scope In: Scope Out: Findings:      The examined esophagus was normal.      The Z-line was regular and was found 40 cm from the incisors.      One razor blade and 3 medium-sized toothbrushes were found in the       gastric fundus. Removal was accomplished with an overtube, rat-toothed       forceps, regular forceps and Roth net. Lucina Mellow net was used to remove 2 of  the toothbrushes and a rat tooth forceps was used to remove the 3rd       toothbrush. A rat tooth forceps with a hood on the tip of the endoscope       to protect the mucosa was used to safely remove the razor blade. Small       amount of bleeding noted in the oropharynx following removal of the       razor blade that appeared superficial and stopped spontaneously.      Small amount of red blood was found in the gastric fundus from mucosal       trauma from ingestion of the razor blade.      A deformity was found at the pylorus.      The cardia and gastric fundus were normal on retroflexion.      The examined duodenum was normal. Impression:               - Normal  esophagus.                           - Z-line regular, 40 cm from the incisors.                           - One razor blade and 3 medium-sized toothbrushes                            were found in the stomach. Removal was successful.                           - Red blood in the gastric fundus.                           - Deformity in the pylorus.                           - Normal examined duodenum. Recommendation:           - Patient has a contact number available for                            emergencies. The signs and symptoms of potential                            delayed complications were discussed with the                            patient. Return to normal activities tomorrow.                            Written discharge instructions were provided to the                            patient.                           - Clear liquid diet.                           -  Observe patient's clinical course.                           - When he is able to tolerate clear liquids then                            can be discharged back to his facility. Procedure Code(s):        --- Professional ---                           [id number], Esophagogastroduodenoscopy, flexible,                            transoral; with removal of foreign body(s) Diagnosis Code(s):        --- Professional ---                           Y07.3XTG, Foreign body in stomach, initial encounter                           K92.2, Gastrointestinal hemorrhage, unspecified                           K31.89, Other diseases of stomach and duodenum CPT copyright 2019 [employer] Association. All rights reserved. The codes documented in this report are preliminary and upon coder review may  be revised to meet current compliance requirements. Shirley Friar, MD 03/31/2020 3:17:46 AM This report has been signed electronically. Number of Addenda: 0

## 2020-03-31 NOTE — Discharge Instructions (Addendum)
It was our pleasure to provide your ER care today.  Keep wound very clean. Change dressing daily.   Return to ER if worse, infection of wound (spreading redness, pus, fevers, severe pain), or other concern.

## 2020-03-31 NOTE — Interval H&P Note (Signed)
History and Physical Interval Note:  03/31/2020 1:44 AM  Norman Vasquez  has presented today for surgery, with the diagnosis of GI bleed- swallowed razor blades and toothbrushes.  The various methods of treatment have been discussed with the patient and family. After consideration of risks, benefits and other options for treatment, the patient has consented to  Procedure(s): ESOPHAGOGASTRODUODENOSCOPY (EGD) (N/A) as a surgical intervention.  The patient's history has been reviewed, patient examined, no change in status, stable for surgery.  I have reviewed the patient's chart and labs.  Questions were answered to the patient's satisfaction.     Shirley Friar

## 2020-04-02 ENCOUNTER — Encounter (HOSPITAL_COMMUNITY): Payer: Self-pay | Admitting: Gastroenterology

## 2021-07-28 DEATH — deceased

## 2021-12-07 IMAGING — DX DG ABD PORTABLE 1V
2 series · 2 of 2 positions shown · non-contrast
Comparison: 08/15/2019

CLINICAL DATA: Swallowed a razor blade

EXAM:
PORTABLE ABDOMEN - 1 VIEW

[abdomen kub (1 of 2)]
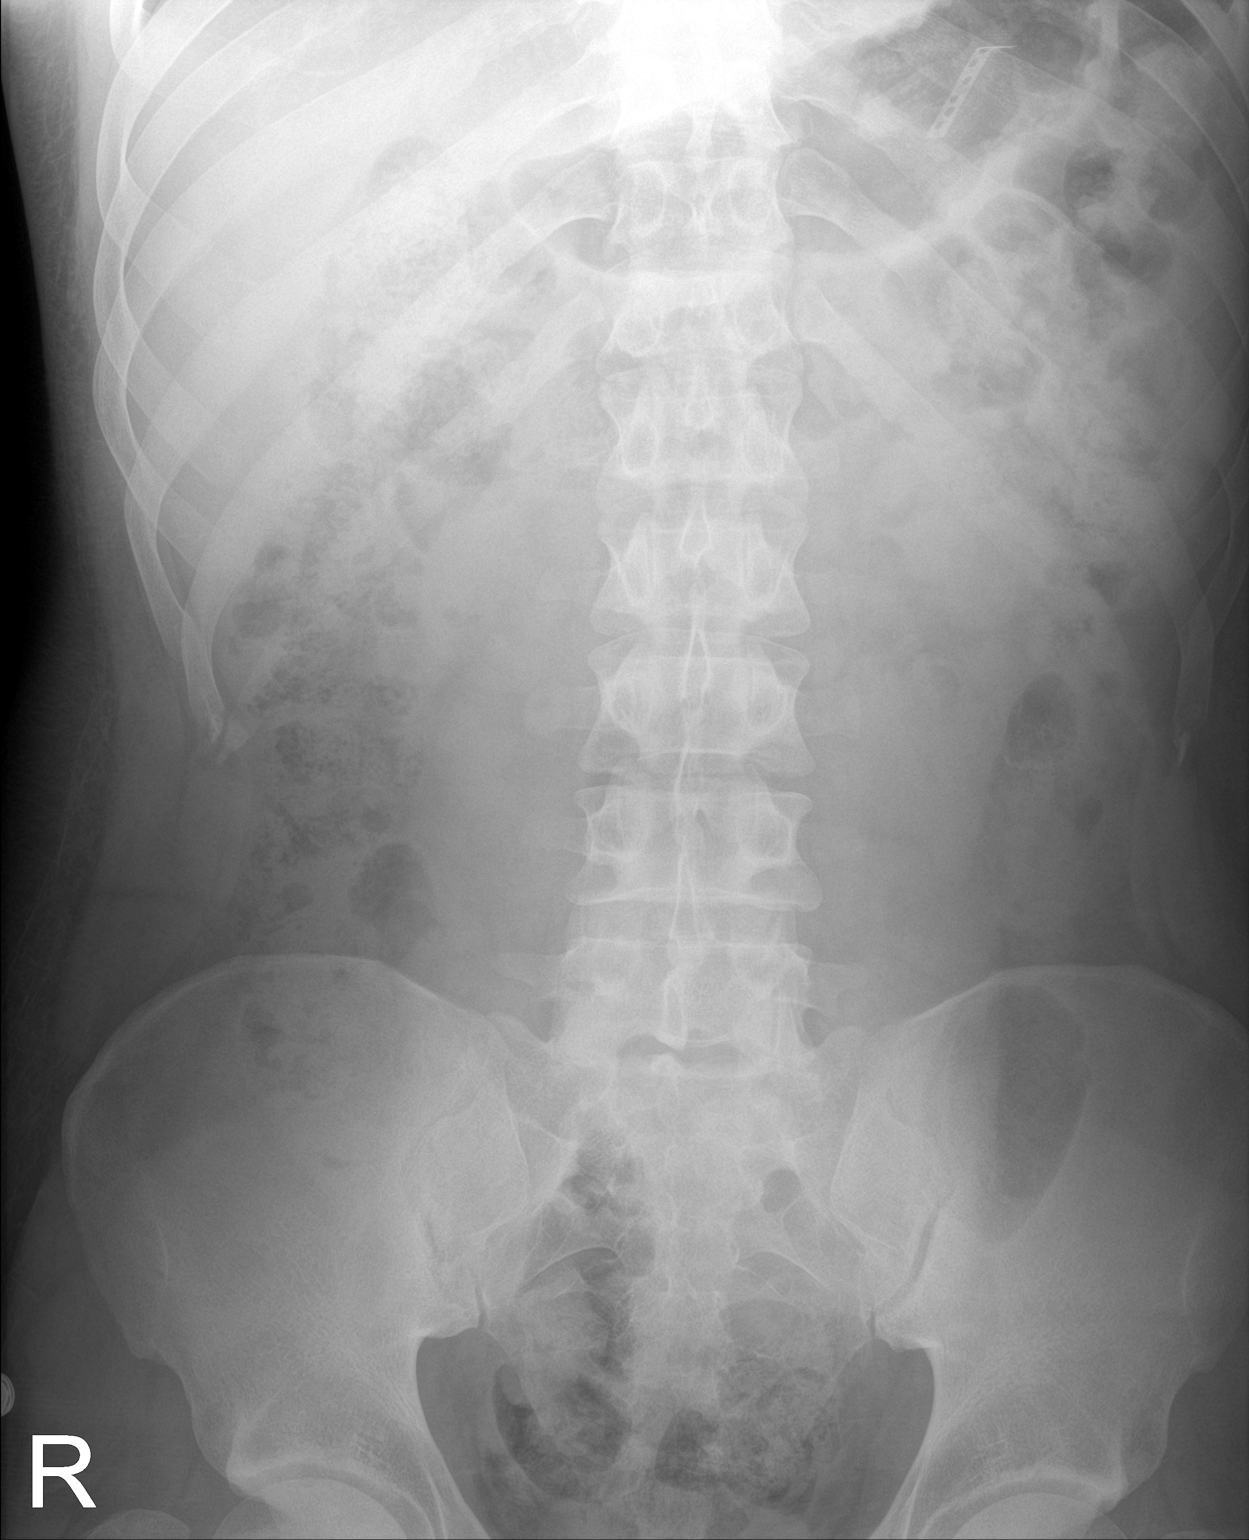

[abdomen kub (2 of 2)]
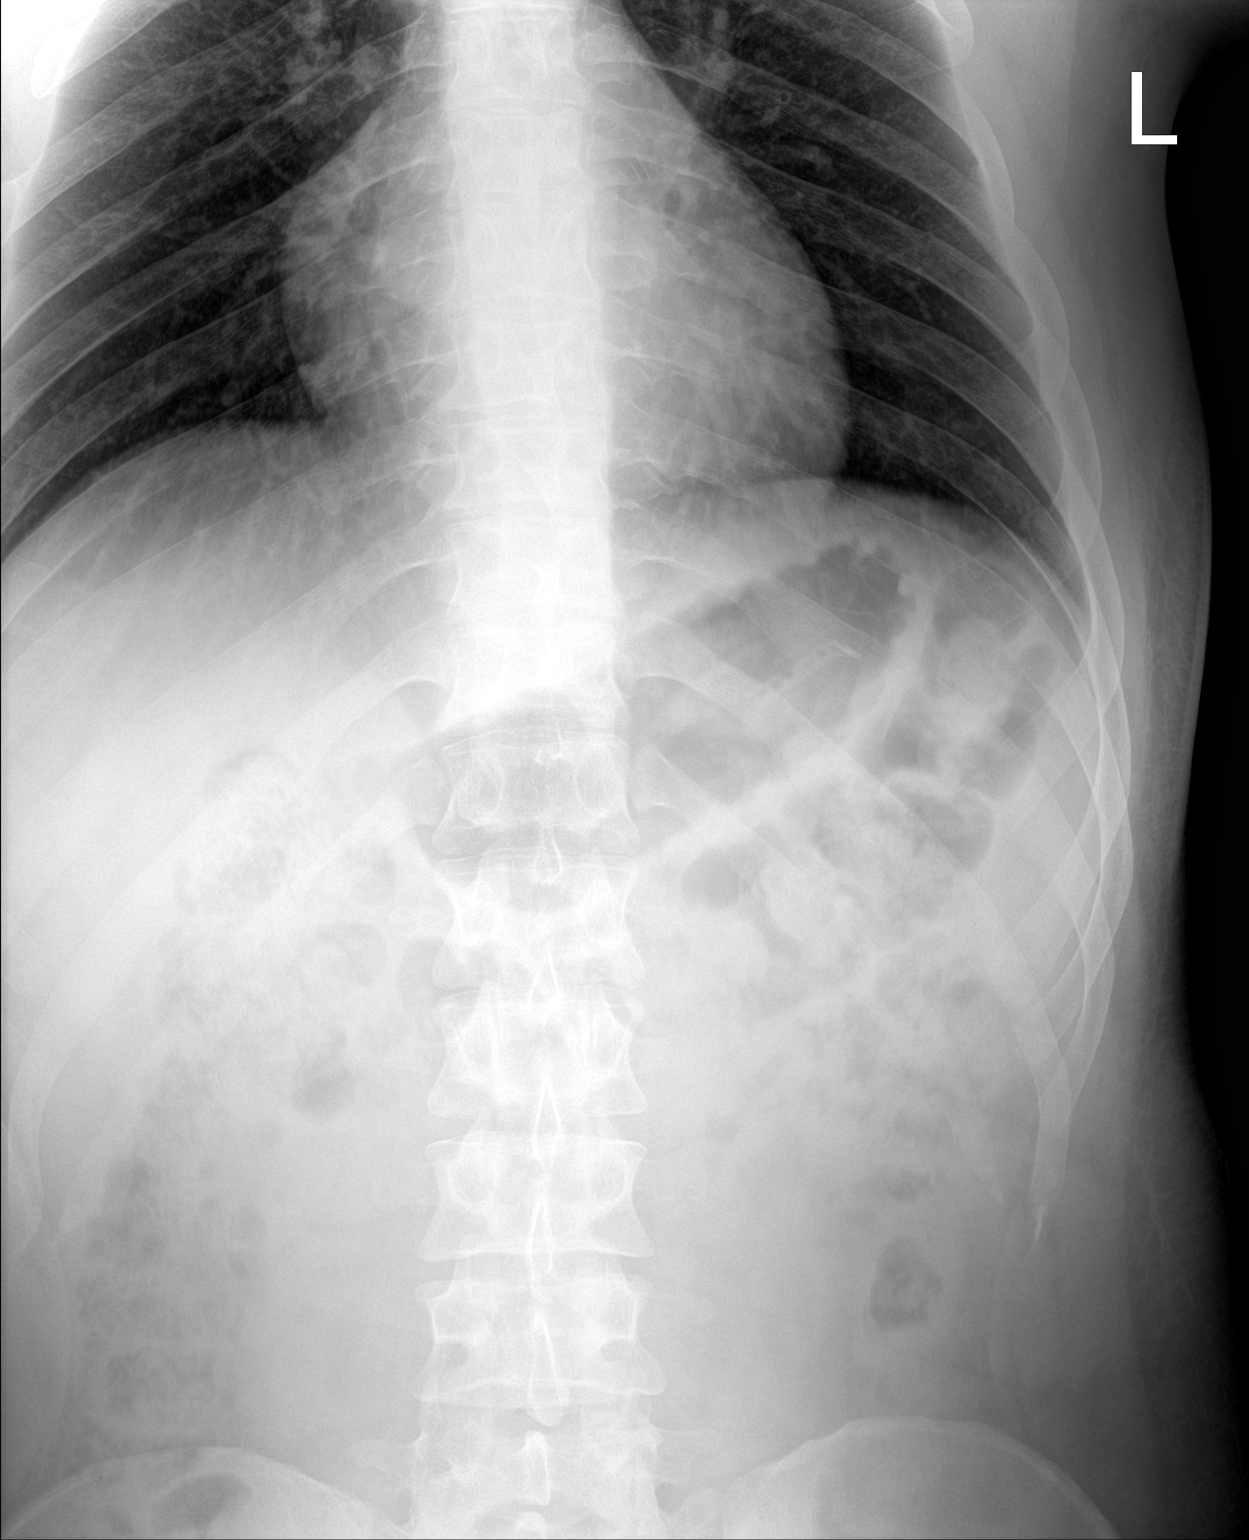

[2 of 2 positions shown; findings below may reference images not displayed]

FINDINGS: 2 supine frontal views of the abdomen and pelvis demonstrate 3.5 cm
metallic foreign body in the region the gastric fundus consistent
with ingested razor blade. No bowel obstruction or ileus. No
evidence of free intra-abdominal gas. No acute bony abnormalities.
IMPRESSION: 1. Metallic foreign body in the region the gastric fundus consistent
with ingested razor blade.
2. No evidence of bowel perforation.
